# Patient Record
Sex: Male | Born: 1985
Health system: Southern US, Community
[De-identification: ages and names within clinical notes are randomized; demographics above are authoritative.]

## PROBLEM LIST (undated history)

## (undated) DIAGNOSIS — I10 Essential (primary) hypertension: Secondary | ICD-10-CM

## (undated) DIAGNOSIS — K219 Gastro-esophageal reflux disease without esophagitis: Secondary | ICD-10-CM

## (undated) DIAGNOSIS — E785 Hyperlipidemia, unspecified: Secondary | ICD-10-CM

## (undated) DIAGNOSIS — R740 Nonspecific elevation of levels of transaminase and lactic acid dehydrogenase [LDH]: Secondary | ICD-10-CM

## (undated) DIAGNOSIS — Z8489 Family history of other specified conditions: Secondary | ICD-10-CM

## (undated) HISTORY — DX: Essential (primary) hypertension: I10

## (undated) HISTORY — DX: Family history of other specified conditions: Z84.89

## (undated) HISTORY — DX: Gastro-esophageal reflux disease without esophagitis: K21.9

## (undated) HISTORY — DX: Hyperlipidemia, unspecified: E78.5

## (undated) HISTORY — DX: Nonspecific elevation of levels of transaminase and lactic acid dehydrogenase (ldh): R74.0

---
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
## 2006-01-01 ENCOUNTER — Emergency Department (HOSPITAL_COMMUNITY): Admission: EM | Admit: 2006-01-01 | Discharge: 2006-01-01 | Payer: Self-pay | Admitting: Family Medicine

## 2007-06-22 HISTORY — PX: WISDOM TOOTH EXTRACTION: SHX21

## 2010-06-21 DIAGNOSIS — I10 Essential (primary) hypertension: Secondary | ICD-10-CM

## 2010-06-21 HISTORY — DX: Essential (primary) hypertension: I10

## 2012-06-21 DIAGNOSIS — R7401 Elevation of levels of liver transaminase levels: Secondary | ICD-10-CM

## 2012-06-21 DIAGNOSIS — E785 Hyperlipidemia, unspecified: Secondary | ICD-10-CM

## 2012-06-21 HISTORY — DX: Hyperlipidemia, unspecified: E78.5

## 2012-06-21 HISTORY — DX: Elevation of levels of liver transaminase levels: R74.01

## 2013-03-15 LAB — TSH: TSH: 1.27

## 2013-03-15 LAB — COMPREHENSIVE METABOLIC PANEL
ALT: 91 U/L — AB (ref 10–40)
Alkaline Phosphatase: 75 U/L
Creat: 1.08
Glucose: 85
Uric Acid: 8.6

## 2013-03-15 LAB — LIPID PANEL: LDL (calc): 122

## 2013-03-15 LAB — CBC
HGB: 16.3 g/dL
Platelets: 350
WBC: 6.5

## 2013-03-20 LAB — PULMONARY FUNCTION TEST

## 2013-04-02 ENCOUNTER — Telehealth: Payer: Self-pay

## 2013-04-02 NOTE — Telephone Encounter (Signed)
May place in any open 30 min slot 

## 2013-04-02 NOTE — Telephone Encounter (Signed)
The patient called and is hoping to become a new pt and have a physical done.  He states he is trying to obtain a job and they require a physical documented before he can move forward with the hiring process.  He states he has no major medical issues.  Can this patient be worked into to two 15 min slots?    Pt callback - [phone]

## 2013-04-09 ENCOUNTER — Encounter: Payer: Self-pay | Admitting: Family Medicine

## 2013-04-09 ENCOUNTER — Ambulatory Visit (INDEPENDENT_AMBULATORY_CARE_PROVIDER_SITE_OTHER): Payer: BC Managed Care – PPO | Admitting: Family Medicine

## 2013-04-09 ENCOUNTER — Telehealth: Payer: Self-pay | Admitting: Family Medicine

## 2013-04-09 VITALS — BP 110/80 | HR 68 | Temp 98.3°F | Ht 68.0 in | Wt 247.0 lb

## 2013-04-09 DIAGNOSIS — E785 Hyperlipidemia, unspecified: Secondary | ICD-10-CM

## 2013-04-09 DIAGNOSIS — I10 Essential (primary) hypertension: Secondary | ICD-10-CM | POA: Insufficient documentation

## 2013-04-09 DIAGNOSIS — Z0000 Encounter for general adult medical examination without abnormal findings: Secondary | ICD-10-CM | POA: Insufficient documentation

## 2013-04-09 DIAGNOSIS — R7401 Elevation of levels of liver transaminase levels: Secondary | ICD-10-CM | POA: Insufficient documentation

## 2013-04-09 DIAGNOSIS — E669 Obesity, unspecified: Secondary | ICD-10-CM

## 2013-04-09 DIAGNOSIS — Z0001 Encounter for general adult medical examination with abnormal findings: Secondary | ICD-10-CM | POA: Insufficient documentation

## 2013-04-09 MED ORDER — LISINOPRIL 10 MG PO TABS: 10.0000 mg | ORAL_TABLET | Freq: Every day | ORAL | Status: DC

## 2013-04-09 NOTE — Patient Instructions (Signed)
Good to meet you today, call us with questions. Return as needed or in 1 year for next physical.

## 2013-04-09 NOTE — Progress Notes (Signed)
Subjective:    Patient ID: Perry Bender, male    DOB: [birth year], 27 y.o.   MRN: [id number]  HPI CC: new pt to establish  Prior PCP was Dr. Prince Rome who recently moved.  Recent physical done at work including stress test, EKG, labwork and spirometry. (03/15/2013) Spirometry showing FVC 91%, FEV1 94%, ratio 0.84  HTN - compliant with lisinopril 10mg  daily (2012).  No HA, vision changes, CP/tightness, SOB, leg swelling.   Obesity - 256lb to 237lb.  Dieting and staying active.  On review of blood work he brings - transaminitis with AST 47 and ALT 91 found.  lipid panel - TC 186, Trig 193, HDL 25, LDL 122.  Seat belt use discussed. Sunscreen use discussed.  Lives alone, 1 dog Occupation: IT sales professional at [employer], owns mowing business Edu: HS Activity: mows yards Diet: good water, fruits/vegetables daily, herbalife  Medications and allergies reviewed and updated in chart.  Past histories reviewed and updated if relevant as below. There are no active problems to display for this patient.  Past Medical History  Diagnosis Date  . HTN (hypertension) 2012   Past Surgical History  Procedure Laterality Date  . No past surgeries     History  Substance Use Topics  . Smoking status: Never Smoker   . Smokeless tobacco: Never Used  . Alcohol Use: Yes     Comment: Occasional   Family History  Problem Relation Age of Onset  . Cancer Mother     thyroid  . Cancer Father     carcinoid tumor  . Cancer Maternal Grandfather     prostate  . Stroke Paternal Grandfather   . Diabetes Father   . Diabetes Maternal Grandfather   . CAD Maternal Grandmother     CABG x2   Allergies  Allergen Reactions  . Hctz [Hydrochlorothiazide] Other (See Comments)    Headaches   No current outpatient prescriptions on file prior to visit.   No current facility-administered medications on file prior to visit.     Review of Systems  Constitutional: Negative for fever, chills, activity change,  appetite change, fatigue and unexpected weight change.  HENT: Negative for hearing loss.   Eyes: Negative for visual disturbance.  Respiratory: Negative for cough, chest tightness, shortness of breath and wheezing.   Cardiovascular: Negative for chest pain.  Gastrointestinal: Negative for nausea, vomiting, abdominal pain, diarrhea, constipation, blood in stool and abdominal distention.  Genitourinary: Negative for hematuria and difficulty urinating.  Musculoskeletal: Negative for arthralgias, myalgias and neck pain.  Skin: Negative for rash.  Neurological: Negative for dizziness, seizures, syncope and headaches.  Hematological: Negative for adenopathy. Does not bruise/bleed easily.  Psychiatric/Behavioral: Negative for dysphoric mood. The patient is not nervous/anxious.        Objective:   Physical Exam  Nursing note and vitals reviewed. Constitutional: He is oriented to person, place, and time. He appears well-developed and well-nourished. No distress.  obese Body mass index is 37.56 kg/(m^2).  HENT:  Head: Normocephalic and atraumatic.  Right Ear: Hearing, tympanic membrane, external ear and ear canal normal.  Left Ear: Hearing, tympanic membrane, external ear and ear canal normal.  Nose: Nose normal.  Mouth/Throat: Oropharynx is clear and moist. No oropharyngeal exudate.  Eyes: Conjunctivae and EOM are normal. Pupils are equal, round, and reactive to light. No scleral icterus.  Neck: Normal range of motion. Neck supple. No thyromegaly present.  Cardiovascular: Normal rate, regular rhythm, normal heart sounds and intact distal pulses.   No murmur heard. Pulses:  Radial pulses are 2+ on the right side, and 2+ on the left side.  Pulmonary/Chest: Effort normal and breath sounds normal. No respiratory distress. He has no wheezes. He has no rales.  Abdominal: Soft. Bowel sounds are normal. He exhibits no distension and no mass. There is no tenderness. There is no rebound and no  guarding.  Musculoskeletal: Normal range of motion. He exhibits no edema.  Lymphadenopathy:    He has no cervical adenopathy.  Neurological: He is alert and oriented to person, place, and time.  CN grossly intact, station and gait intact  Skin: Skin is warm and dry. No rash noted.  Psychiatric: He has a normal mood and affect. His behavior is normal. Judgment and thought content normal.       Assessment & Plan:

## 2013-04-09 NOTE — Telephone Encounter (Signed)
plz notify that on review of recent labwork - he did have elevated liver function as well as abnormal cholesterol levels. Please ensure not taking excessive amounts of tylenol. i'd like him to return in 4-6 months for recheck liver function. Weight loss will help with both numbers.

## 2013-04-09 NOTE — Assessment & Plan Note (Signed)
Preventative protocols reviewed and updated unless pt declined. Discussed healthy diet and lifestyle.  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  Hoping to transfer to [facility]

## 2013-04-09 NOTE — Assessment & Plan Note (Signed)
Chronic, stable. Refilled 3 mo supply lisinopril.

## 2013-04-09 NOTE — Assessment & Plan Note (Signed)
Dyslipidemia by evidence of blood work last month. Pt working currently on weight loss - which should help. Will need to monitor.

## 2013-04-09 NOTE — Assessment & Plan Note (Signed)
Mild transaminitis on blood work last month. ?fatty liver.  Will have him return in 4-6 mo for recheck and further eval, consider abd [place].

## 2013-04-09 NOTE — Assessment & Plan Note (Signed)
Body mass index is 37.56 kg/(m^2). Pt working on weight loss.  Using herbalife. Encouraged continued attempt.

## 2013-04-10 NOTE — Telephone Encounter (Signed)
Patient notified. He denied excessive tylenol or alcohol use. Advised to try to avoid both if possible. He will call back to schedule lab appt when he knows work schedule. He is already working on weight loss.

## 2013-04-12 ENCOUNTER — Encounter: Payer: Self-pay | Admitting: *Deleted

## 2014-05-20 ENCOUNTER — Other Ambulatory Visit: Payer: Self-pay | Admitting: Family Medicine

## 2014-06-02 ENCOUNTER — Other Ambulatory Visit: Payer: Self-pay | Admitting: Family Medicine

## 2014-06-04 NOTE — Telephone Encounter (Signed)
Received refill request electronically from pharmacy. Last refill 05/20/14 note was sent for patient to schedule an appointment for further refills. Last office visit 04/09/13. Is it okay to refill medication?

## 2014-06-21 DIAGNOSIS — Z8489 Family history of other specified conditions: Secondary | ICD-10-CM

## 2014-06-21 HISTORY — DX: Family history of other specified conditions: Z84.89

## 2014-08-01 ENCOUNTER — Encounter: Payer: Self-pay | Admitting: Family Medicine

## 2014-08-01 ENCOUNTER — Ambulatory Visit (INDEPENDENT_AMBULATORY_CARE_PROVIDER_SITE_OTHER): Payer: PRIVATE HEALTH INSURANCE | Admitting: Family Medicine

## 2014-08-01 VITALS — BP 138/72 | HR 84 | Temp 98.3°F | Wt 253.2 lb

## 2014-08-01 DIAGNOSIS — R197 Diarrhea, unspecified: Secondary | ICD-10-CM | POA: Insufficient documentation

## 2014-08-01 DIAGNOSIS — I10 Essential (primary) hypertension: Secondary | ICD-10-CM

## 2014-08-01 MED ORDER — LISINOPRIL 10 MG PO TABS: 10.0000 mg | ORAL_TABLET | Freq: Every day | ORAL | Status: DC

## 2014-08-01 MED ORDER — CIPROFLOXACIN HCL 500 MG PO TABS: 500.0000 mg | ORAL_TABLET | Freq: Two times a day (BID) | ORAL | Status: DC

## 2014-08-01 NOTE — Progress Notes (Signed)
   BP 138/72 mmHg  Pulse 84  Temp(Src) 98.3 F (36.8 C) (Oral)  Wt 253 lb 4 oz (114.873 kg)   CC: abd pain  Subjective:    Patient ID: Perry Bender, male    DOB: [birth year], 29 y.o.   MRN: [id number]  HPI: Perry Bender is a 29 y.o. male presenting on 08/01/2014 for Abdominal Pain   HTN - well controlled on lisinopril. Doesn't check at home. No HA, vision changes, CP/tightness, SOB, leg swelling.   Abdominal pain with diarrhea for last week. Diarrhea watery initially. Nauseated, diarrhea x 10. Cramping abdominal pain. No new foods, recent travel. No one else sick at home. Some accidents in bed. No vomiting. No blood or mucous in stool. No fevers.  Anything he eats or drinks causes nausea and diarrhea. Thinks he's staying well hydrated.  12mo daughter started to feel sick at home today.   Initially treated with pepto bismol, then immodium - no improvement.   Uses well water but tends to drink bottled water. Voiding ok.   Relevant past medical, surgical, family and social history reviewed and updated as indicated. Interim medical history since our last visit reviewed. Allergies and medications reviewed and updated. No current outpatient prescriptions on file prior to visit.   No current facility-administered medications on file prior to visit.    Review of Systems Per HPI unless specifically indicated above     Objective:    BP 138/72 mmHg  Pulse 84  Temp(Src) 98.3 F (36.8 C) (Oral)  Wt 253 lb 4 oz (114.873 kg)  Wt Readings from Last 3 Encounters:  08/01/14 253 lb 4 oz (114.873 kg)  04/09/13 247 lb (112.038 kg)    Physical Exam  Constitutional: He appears well-developed and well-nourished. No distress.  HENT:  Mouth/Throat: Oropharynx is clear and moist. No oropharyngeal exudate.  Cardiovascular: Normal rate, regular rhythm, normal heart sounds and intact distal pulses.   No murmur heard. Pulmonary/Chest: Effort normal and breath sounds normal. No respiratory  distress. He has no wheezes. He has no rales.  Abdominal: Soft. Normal appearance and bowel sounds are normal. He exhibits no distension and no mass. There is no hepatosplenomegaly. There is no tenderness. There is no rebound, no guarding and no CVA tenderness.  Musculoskeletal: He exhibits no edema.  Lymphadenopathy:    He has no cervical adenopathy.  Skin: Skin is warm and dry. No rash noted.  Nursing note and vitals reviewed.     Assessment & Plan:   Problem List Items Addressed This Visit    HTN (hypertension)    Chronic, stable. Med refilled for 6 months. Advised hold lisinopril while having diarrhea.      Relevant Medications   lisinopril (PRINIVIL,ZESTRIL) tablet   Diarrhea - Primary    Anticipate infectious diarrhea - given duration and frequency of stool episodes treat with cipro 3d course. No bloody stool, no fever. Update if not improving as expected. Did not send stool culture today. Discussed anticipated course of improvement and red flags to seek further care. Discussed contagious through diarrhea. Pt agrees with plan.           Follow up plan: Return in about 6 months (around 01/30/2015), or as needed, for physical.

## 2014-08-01 NOTE — Assessment & Plan Note (Signed)
Anticipate infectious diarrhea - given duration and frequency of stool episodes treat with cipro 3d course. No bloody stool, no fever. Update if not improving as expected. Did not send stool culture today. Discussed anticipated course of improvement and red flags to seek further care. Discussed contagious through diarrhea. Pt agrees with plan.

## 2014-08-01 NOTE — Progress Notes (Signed)
Pre visit review using our clinic review tool, if applicable. No additional management support is needed unless otherwise documented below in the visit note. 

## 2014-08-01 NOTE — Assessment & Plan Note (Signed)
Chronic, stable. Med refilled for 6 months. Advised hold lisinopril while having diarrhea.

## 2014-08-01 NOTE — Patient Instructions (Addendum)
I think you had stomach bug, possibly bacterial so treat with cipro antibiotic twice daily for 3 days. Push fluids and rest. Bland diet for next few days. Appetite should slowly start improving. Let me know if not improving as expected. Hold lisinopril until diarrhea has resolved.  Return in next 4-6 months for physical.  Diarrhea Diarrhea is frequent loose and watery bowel movements. It can cause you to feel weak and dehydrated. Dehydration can cause you to become tired and thirsty, have a dry mouth, and have decreased urination that often is dark yellow. Diarrhea is a sign of another problem, most often an infection that will not last long. In most cases, diarrhea typically lasts 2-3 days. However, it can last longer if it is a sign of something more serious. It is important to treat your diarrhea as directed by your caregiver to lessen or prevent future episodes of diarrhea. CAUSES  Some common causes include:  Gastrointestinal infections caused by viruses, bacteria, or parasites.  Food poisoning or food allergies.  Certain medicines, such as antibiotics, chemotherapy, and laxatives.  Artificial sweeteners and fructose.  Digestive disorders. HOME CARE INSTRUCTIONS  Ensure adequate fluid intake (hydration): Have 1 cup (8 oz) of fluid for each diarrhea episode. Avoid fluids that contain simple sugars or sports drinks, fruit juices, whole milk products, and sodas. Your urine should be clear or pale yellow if you are drinking enough fluids. Hydrate with an oral rehydration solution that you can purchase at pharmacies, retail stores, and online. You can prepare an oral rehydration solution at home by mixing the following ingredients together:   - tsp table salt.   tsp baking soda.   tsp salt substitute containing potassium chloride.  1  tablespoons sugar.  1 L (34 oz) of water.  Certain foods and beverages may increase the speed at which food moves through the gastrointestinal (GI)  tract. These foods and beverages should be avoided and include:  Caffeinated and alcoholic beverages.  High-fiber foods, such as raw fruits and vegetables, nuts, seeds, and whole grain breads and cereals.  Foods and beverages sweetened with sugar alcohols, such as xylitol, sorbitol, and mannitol.  Some foods may be well tolerated and may help thicken stool including:  Starchy foods, such as rice, toast, pasta, low-sugar cereal, oatmeal, grits, baked potatoes, crackers, and bagels.  Bananas.  Applesauce.  Add probiotic-rich foods to help increase healthy bacteria in the GI tract, such as yogurt and fermented milk products.  Wash your hands well after each diarrhea episode.  Only take over-the-counter or prescription medicines as directed by your caregiver.  Take a warm bath to relieve any burning or pain from frequent diarrhea episodes. SEEK IMMEDIATE MEDICAL CARE IF:   You are unable to keep fluids down.  You have persistent vomiting.  You have blood in your stool, or your stools are black and tarry.  You do not urinate in 6-8 hours, or there is only a small amount of very dark urine.  You have abdominal pain that increases or localizes.  You have weakness, dizziness, confusion, or light-headedness.  You have a severe headache.  Your diarrhea gets worse or does not get better.  You have a fever or persistent symptoms for more than 2-3 days.  You have a fever and your symptoms suddenly get worse. MAKE SURE YOU:   Understand these instructions.  Will watch your condition.  Will get help right away if you are not doing well or get worse. Document Released: 05/28/2002 Document Revised:  10/22/2013 Document Reviewed: 02/13/2012 ExitCare Patient Information 2015 [place], [place]. This information is not intended to replace advice given to you by your health care provider. Make sure you discuss any questions you have with your health care provider.

## 2014-08-02 ENCOUNTER — Telehealth: Payer: Self-pay | Admitting: Family Medicine

## 2014-08-02 NOTE — Telephone Encounter (Signed)
emmi emailed °

## 2015-01-04 ENCOUNTER — Other Ambulatory Visit: Payer: Self-pay | Admitting: Family Medicine

## 2015-01-04 DIAGNOSIS — I10 Essential (primary) hypertension: Secondary | ICD-10-CM

## 2015-01-04 DIAGNOSIS — E785 Hyperlipidemia, unspecified: Secondary | ICD-10-CM

## 2015-01-04 DIAGNOSIS — R7401 Elevation of levels of liver transaminase levels: Secondary | ICD-10-CM

## 2015-01-04 DIAGNOSIS — R740 Nonspecific elevation of levels of transaminase and lactic acid dehydrogenase [LDH]: Secondary | ICD-10-CM

## 2015-01-04 DIAGNOSIS — E669 Obesity, unspecified: Secondary | ICD-10-CM

## 2015-01-14 ENCOUNTER — Other Ambulatory Visit (INDEPENDENT_AMBULATORY_CARE_PROVIDER_SITE_OTHER): Payer: 59

## 2015-01-14 DIAGNOSIS — I10 Essential (primary) hypertension: Secondary | ICD-10-CM

## 2015-01-14 DIAGNOSIS — E785 Hyperlipidemia, unspecified: Secondary | ICD-10-CM | POA: Diagnosis not present

## 2015-01-14 DIAGNOSIS — R7401 Elevation of levels of liver transaminase levels: Secondary | ICD-10-CM

## 2015-01-14 DIAGNOSIS — R740 Nonspecific elevation of levels of transaminase and lactic acid dehydrogenase [LDH]: Secondary | ICD-10-CM

## 2015-01-14 DIAGNOSIS — E669 Obesity, unspecified: Secondary | ICD-10-CM | POA: Diagnosis not present

## 2015-01-14 LAB — LIPID PANEL
CHOL/HDL RATIO: 6
Cholesterol: 162 mg/dL (ref 0–200)
HDL: 26.9 mg/dL — ABNORMAL LOW (ref >39.00)
LDL CALC: 111 mg/dL — AB (ref 0–99)
NONHDL: 135.1
Triglycerides: 122 mg/dL (ref 0.0–149.0)
VLDL: 24.4 mg/dL (ref 0.0–40.0)

## 2015-01-14 LAB — COMPREHENSIVE METABOLIC PANEL
ALT: 44 U/L (ref 0–53)
AST: 21 U/L (ref 0–37)
Albumin: 4.3 g/dL (ref 3.5–5.2)
Alkaline Phosphatase: 67 U/L (ref 39–117)
BILIRUBIN TOTAL: 0.5 mg/dL (ref 0.2–1.2)
BUN: 12 mg/dL (ref 6–23)
CO2: 30 mEq/L (ref 19–32)
CREATININE: 1 mg/dL (ref 0.40–1.50)
Calcium: 9.4 mg/dL (ref 8.4–10.5)
Chloride: 102 mEq/L (ref 96–112)
GFR: 94.07 mL/min (ref >60.00)
Glucose, Bld: 93 mg/dL (ref 70–99)
POTASSIUM: 4.8 meq/L (ref 3.5–5.1)
SODIUM: 137 meq/L (ref 135–145)
Total Protein: 7.4 g/dL (ref 6.0–8.3)

## 2015-01-21 ENCOUNTER — Ambulatory Visit (INDEPENDENT_AMBULATORY_CARE_PROVIDER_SITE_OTHER): Payer: 59 | Admitting: Family Medicine

## 2015-01-21 ENCOUNTER — Encounter: Payer: Self-pay | Admitting: Family Medicine

## 2015-01-21 ENCOUNTER — Encounter (INDEPENDENT_AMBULATORY_CARE_PROVIDER_SITE_OTHER): Payer: Self-pay

## 2015-01-21 VITALS — BP 130/70 | HR 76 | Temp 98.1°F | Wt 249.0 lb

## 2015-01-21 DIAGNOSIS — E785 Hyperlipidemia, unspecified: Secondary | ICD-10-CM

## 2015-01-21 DIAGNOSIS — R740 Nonspecific elevation of levels of transaminase and lactic acid dehydrogenase [LDH]: Secondary | ICD-10-CM

## 2015-01-21 DIAGNOSIS — G5601 Carpal tunnel syndrome, right upper limb: Secondary | ICD-10-CM

## 2015-01-21 DIAGNOSIS — R7401 Elevation of levels of liver transaminase levels: Secondary | ICD-10-CM

## 2015-01-21 DIAGNOSIS — Z8489 Family history of other specified conditions: Secondary | ICD-10-CM

## 2015-01-21 DIAGNOSIS — I10 Essential (primary) hypertension: Secondary | ICD-10-CM | POA: Diagnosis not present

## 2015-01-21 DIAGNOSIS — G5602 Carpal tunnel syndrome, left upper limb: Secondary | ICD-10-CM

## 2015-01-21 DIAGNOSIS — G5600 Carpal tunnel syndrome, unspecified upper limb: Secondary | ICD-10-CM | POA: Insufficient documentation

## 2015-01-21 DIAGNOSIS — G5603 Carpal tunnel syndrome, bilateral upper limbs: Secondary | ICD-10-CM

## 2015-01-21 MED ORDER — LISINOPRIL 10 MG PO TABS: 10.0000 mg | ORAL_TABLET | Freq: Every day | ORAL | Status: DC

## 2015-01-21 NOTE — Assessment & Plan Note (Signed)
Chronic, stable. Med refilled for 1 year. Discussed importance of hydration status with ACEI.

## 2015-01-21 NOTE — Assessment & Plan Note (Signed)
Continue to monitor for now. May request hand surgery referral in future.

## 2015-01-21 NOTE — Patient Instructions (Addendum)
med refilled today. Return as needed or in 1 year for follow up.

## 2015-01-21 NOTE — Progress Notes (Signed)
Pre visit review using our clinic review tool, if applicable. No additional management support is needed unless otherwise documented below in the visit note. 

## 2015-01-21 NOTE — Progress Notes (Signed)
BP 130/70 mmHg  Pulse 76  Temp(Src) 98.1 F (36.7 C) (Oral)  Wt 249 lb (112.946 kg)   CC: 46mo f/u visit  Subjective:    Patient ID: Perry Bender, male    DOB: [birth year], 29 y.o.   MRN: [id number]  HPI: ALEXANDAR WEISENBERGER is a 29 y.o. male presenting on 01/21/2015 for Follow-up   HTN - Compliant with current antihypertensive regimen of lisinopril 10mg  daily.  Does not check blood pressures at home.  No low blood pressure readings or symptoms of dizziness/syncope.  Denies HA, vision changes, CP/tightness, SOB, leg swelling.    CTS bothering him. Packed away wrist braces. May want to see ortho for definitive treatment during winter.  Cousin and uncle tested positive for malignant hyperthermia  Relevant past medical, surgical, family and social history reviewed and updated as indicated. Interim medical history since our last visit reviewed. Allergies and medications reviewed and updated. No current outpatient prescriptions on file prior to visit.   No current facility-administered medications on file prior to visit.    Review of Systems Per HPI unless specifically indicated above     Objective:    BP 130/70 mmHg  Pulse 76  Temp(Src) 98.1 F (36.7 C) (Oral)  Wt 249 lb (112.946 kg)  Wt Readings from Last 3 Encounters:  01/21/15 249 lb (112.946 kg)  08/01/14 253 lb 4 oz (114.873 kg)  04/09/13 247 lb (112.038 kg)    Physical Exam  Constitutional: He appears well-developed and well-nourished. No distress.  HENT:  Mouth/Throat: Oropharynx is clear and moist. No oropharyngeal exudate.  Cardiovascular: Normal rate, regular rhythm, normal heart sounds and intact distal pulses.   No murmur heard. Pulmonary/Chest: Effort normal and breath sounds normal. No respiratory distress. He has no wheezes. He has no rales.  Skin: Skin is warm and dry. No rash noted.  Nursing note and vitals reviewed.  Results for orders placed or performed in visit on 01/14/15  Comprehensive metabolic  panel  Result Value Ref Range   Sodium 137 135 - 145 mEq/L   Potassium 4.8 3.5 - 5.1 mEq/L   Chloride 102 96 - 112 mEq/L   CO2 30 19 - 32 mEq/L   Glucose, Bld 93 70 - 99 mg/dL   BUN 12 6 - 23 mg/dL   Creatinine, Ser 1.00 0.40 - 1.50 mg/dL   Total Bilirubin 0.5 0.2 - 1.2 mg/dL   Alkaline Phosphatase 67 39 - 117 U/L   AST 21 0 - 37 U/L   ALT 44 0 - 53 U/L   Total Protein 7.4 6.0 - 8.3 g/dL   Albumin 4.3 3.5 - 5.2 g/dL   Calcium 9.4 8.4 - 10.5 mg/dL   GFR 94.07 >60.00 mL/min  Lipid panel  Result Value Ref Range   Cholesterol 162 0 - 200 mg/dL   Triglycerides 122.0 0.0 - 149.0 mg/dL   HDL 26.90 (L) >39.00 mg/dL   VLDL 24.4 0.0 - 40.0 mg/dL   LDL Cholesterol 111 (H) 0 - 99 mg/dL   Total CHOL/HDL Ratio 6    NonHDL 135.10       Assessment & Plan:   Problem List Items Addressed This Visit    CTS (carpal tunnel syndrome)    Continue to monitor for now. May request hand surgery referral in future.      Dyslipidemia    Mild. Low HDL. LDL adequate.      Family history of malignant hyperthermia   HTN (hypertension) - Primary  Chronic, stable. Med refilled for 1 year. Discussed importance of hydration status with ACEI.      Relevant Medications   lisinopril (PRINIVIL,ZESTRIL) 10 MG tablet   Transaminitis    Resolved.          Follow up plan: Return in about 1 year (around 01/21/2016), or as needed, for annual exam, prior fasting for blood work.

## 2015-01-21 NOTE — Assessment & Plan Note (Signed)
Resolved

## 2015-01-21 NOTE — Assessment & Plan Note (Signed)
Mild. Low HDL. LDL adequate.

## 2016-02-20 ENCOUNTER — Other Ambulatory Visit: Payer: Self-pay | Admitting: Family Medicine

## 2016-08-03 DIAGNOSIS — G43109 Migraine with aura, not intractable, without status migrainosus: Secondary | ICD-10-CM | POA: Diagnosis not present

## 2016-08-10 ENCOUNTER — Ambulatory Visit (INDEPENDENT_AMBULATORY_CARE_PROVIDER_SITE_OTHER): Payer: 59 | Admitting: Family Medicine

## 2016-08-10 ENCOUNTER — Encounter (INDEPENDENT_AMBULATORY_CARE_PROVIDER_SITE_OTHER): Payer: Self-pay

## 2016-08-10 ENCOUNTER — Encounter: Payer: Self-pay | Admitting: Family Medicine

## 2016-08-10 VITALS — BP 140/100 | HR 90 | Temp 98.0°F | Wt 248.8 lb

## 2016-08-10 DIAGNOSIS — J302 Other seasonal allergic rhinitis: Secondary | ICD-10-CM | POA: Insufficient documentation

## 2016-08-10 DIAGNOSIS — IMO0001 Reserved for inherently not codable concepts without codable children: Secondary | ICD-10-CM

## 2016-08-10 DIAGNOSIS — I10 Essential (primary) hypertension: Secondary | ICD-10-CM

## 2016-08-10 DIAGNOSIS — E669 Obesity, unspecified: Secondary | ICD-10-CM | POA: Diagnosis not present

## 2016-08-10 MED ORDER — FLUTICASONE PROPIONATE 50 MCG/ACT NA SUSP: 2.0000 | Freq: Every day | NASAL | 3 refills | Status: DC

## 2016-08-10 MED ORDER — LISINOPRIL 10 MG PO TABS: 10.0000 mg | ORAL_TABLET | Freq: Every day | ORAL | 0 refills | Status: DC

## 2016-08-10 MED ORDER — LISINOPRIL 10 MG PO TABS: 10.0000 mg | ORAL_TABLET | Freq: Every day | ORAL | 1 refills | Status: DC

## 2016-08-10 NOTE — Assessment & Plan Note (Signed)
Discussed antihistamine and INS use - sent in Rx for flonase.

## 2016-08-10 NOTE — Assessment & Plan Note (Signed)
bp elevated today off lisinopril - will restart this.

## 2016-08-10 NOTE — Progress Notes (Signed)
BP (!) 140/100 (BP Location: Right Arm, Cuff Size: Large)   Pulse 90   Temp 98 F (36.7 C) (Oral)   Wt 248 lb 12.8 oz (112.9 kg)   SpO2 97%   BMI 37.83 kg/m    CC: med refill visit Subjective:    Patient ID: Perry Bender, male    DOB: 10-21-1985, 31 y.o.   MRN: [id number]  HPI: Perry Bender is a 31 y.o. male presenting on 08/10/2016 for Blood Pressure Check (Med Refills)   HTN - off lisinopril for last several months. Prior on lisinopril 10mg  daily. HTN dx was made ([id number] systolics) when he was on testosterone booster and smoking. Does not check blood pressures at home but has way of checking. No low blood pressure readings or symptoms of dizziness/syncope. Denies CP/tightness, SOB, leg swelling.   Recent migraine with aura seen by eye doctor. Controlled with excedrin migraine.  Some sinus congestion worse this time of year.   Working on weight loss - watching portions, limiting carbs.   Fmhx malignant hyperthermia.  Relevant past medical, surgical, family and social history reviewed and updated as indicated. Interim medical history since our last visit reviewed. Allergies and medications reviewed and updated. Outpatient Medications Prior to Visit  Medication Sig Dispense Refill  . lisinopril (PRINIVIL,ZESTRIL) 10 MG tablet TAKE 1 TABLET (10 MG TOTAL) BY MOUTH DAILY. 90 tablet 0   No facility-administered medications prior to visit.      Per HPI unless specifically indicated in ROS section below Review of Systems     Objective:    BP (!) 140/100 (BP Location: Right Arm, Cuff Size: Large)   Pulse 90   Temp 98 F (36.7 C) (Oral)   Wt 248 lb 12.8 oz (112.9 kg)   SpO2 97%   BMI 37.83 kg/m   Wt Readings from Last 3 Encounters:  08/10/16 248 lb 12.8 oz (112.9 kg)  01/21/15 249 lb (112.9 kg)  08/01/14 253 lb 4 oz (114.9 kg)    Physical Exam  Constitutional: He appears well-developed and well-nourished. No distress.  HENT:  Head: Normocephalic and atraumatic.    Nose: Nose normal. No mucosal edema or rhinorrhea. Right sinus exhibits no maxillary sinus tenderness and no frontal sinus tenderness. Left sinus exhibits no maxillary sinus tenderness and no frontal sinus tenderness.  Mouth/Throat: Oropharynx is clear and moist. No oropharyngeal exudate.  Nasal mucosal injection  Eyes: Conjunctivae and EOM are normal. Pupils are equal, round, and reactive to light. No scleral icterus.  Neck: Normal range of motion. Neck supple. Carotid bruit is not present.  Cardiovascular: Normal rate, regular rhythm, normal heart sounds and intact distal pulses.   No murmur heard. Pulmonary/Chest: Breath sounds normal. No respiratory distress. He has no wheezes. He has no rales.  Musculoskeletal: He exhibits no edema.  Skin: Skin is warm and dry. No rash noted.  Vitals reviewed.  Results for orders placed or performed in visit on 01/14/15  Comprehensive metabolic panel  Result Value Ref Range   Sodium 137 135 - 145 mEq/L   Potassium 4.8 3.5 - 5.1 mEq/L   Chloride 102 96 - 112 mEq/L   CO2 30 19 - 32 mEq/L   Glucose, Bld 93 70 - 99 mg/dL   BUN 12 6 - 23 mg/dL   Creatinine, Ser 1.00 0.40 - 1.50 mg/dL   Total Bilirubin 0.5 0.2 - 1.2 mg/dL   Alkaline Phosphatase 67 39 - 117 U/L   AST 21 0 - 37 U/L  ALT 44 0 - 53 U/L   Total Protein 7.4 6.0 - 8.3 g/dL   Albumin 4.3 3.5 - 5.2 g/dL   Calcium 9.4 8.4 - 10.5 mg/dL   GFR 94.07 >60.00 mL/min  Lipid panel  Result Value Ref Range   Cholesterol 162 0 - 200 mg/dL   Triglycerides 122.0 0.0 - 149.0 mg/dL   HDL 26.90 (L) >39.00 mg/dL   VLDL 24.4 0.0 - 40.0 mg/dL   LDL Cholesterol 111 (H) 0 - 99 mg/dL   Total CHOL/HDL Ratio 6    NonHDL 135.10       Assessment & Plan:   Problem List Items Addressed This Visit    HTN (hypertension) - Primary    bp elevated today off lisinopril - will restart this.       Relevant Medications   lisinopril (PRINIVIL,ZESTRIL) 10 MG tablet   Obesity, Class II, BMI 35-39.9, with  comorbidity    Discussed healthy diet and lifestyle change to affect sustainable weight loss.       Seasonal allergic rhinitis    Discussed antihistamine and INS use - sent in Rx for flonase.           Follow up plan: Return in about 3 months (around 11/07/2016) for follow up visit.  Perry Bush, MD

## 2016-08-10 NOTE — Assessment & Plan Note (Signed)
Discussed healthy diet and lifestyle change to affect sustainable weight loss.  

## 2016-08-10 NOTE — Progress Notes (Signed)
Pre visit review using our clinic review tool, if applicable. No additional management support is needed unless otherwise documented below in the visit note. 

## 2016-08-10 NOTE — Patient Instructions (Addendum)
Go ahead and start lisinopril 10mg  daily. Monitor blood pressure and let me know if staying elevated.  For sinus congestion/allergies - start daily antihistamine like claritin or zyrtec and flonase nasal steroid.  Return in 3-4 months for follow-up blood pressure.

## 2016-09-21 DIAGNOSIS — Z0000 Encounter for general adult medical examination without abnormal findings: Secondary | ICD-10-CM | POA: Diagnosis not present

## 2016-09-21 DIAGNOSIS — Z008 Encounter for other general examination: Secondary | ICD-10-CM | POA: Diagnosis not present

## 2016-09-22 DIAGNOSIS — Z0000 Encounter for general adult medical examination without abnormal findings: Secondary | ICD-10-CM | POA: Diagnosis not present

## 2016-10-06 ENCOUNTER — Other Ambulatory Visit: Payer: Self-pay | Admitting: Family Medicine

## 2016-10-06 ENCOUNTER — Encounter: Payer: Self-pay | Admitting: Family Medicine

## 2016-10-06 ENCOUNTER — Encounter (INDEPENDENT_AMBULATORY_CARE_PROVIDER_SITE_OTHER): Payer: Self-pay

## 2016-10-06 ENCOUNTER — Ambulatory Visit (INDEPENDENT_AMBULATORY_CARE_PROVIDER_SITE_OTHER): Payer: 59 | Admitting: Family Medicine

## 2016-10-06 VITALS — BP 126/86 | HR 76 | Temp 97.5°F | Wt 242.0 lb

## 2016-10-06 DIAGNOSIS — N509 Disorder of male genital organs, unspecified: Secondary | ICD-10-CM | POA: Diagnosis not present

## 2016-10-06 DIAGNOSIS — I10 Essential (primary) hypertension: Secondary | ICD-10-CM | POA: Diagnosis not present

## 2016-10-06 DIAGNOSIS — N5089 Other specified disorders of the male genital organs: Secondary | ICD-10-CM

## 2016-10-06 MED ORDER — LISINOPRIL 5 MG PO TABS: 5.0000 mg | ORAL_TABLET | Freq: Every day | ORAL | 3 refills | Status: DC

## 2016-10-06 NOTE — Addendum Note (Signed)
Addended by: Royann Shivers A on: 10/06/2016 08:48 AM   Modules accepted: Orders

## 2016-10-06 NOTE — Progress Notes (Signed)
Pre visit review using our clinic review tool, if applicable. No additional management support is needed unless otherwise documented below in the visit note. 

## 2016-10-06 NOTE — Assessment & Plan Note (Signed)
Some low readings and hypotensive symptoms on 10mg  lisinopril - will decrease to 5mg , pt will monitor sxs and BP and update me if concerns (too low, too high). Pt agrees with plan.

## 2016-10-06 NOTE — Progress Notes (Signed)
   BP 126/86   Pulse 76   Temp 97.5 F (36.4 C) (Oral)   Wt 242 lb (109.8 kg)   BMI 36.80 kg/m    CC: check knot on testicle Subjective:    Patient ID: Perry Bender, male    DOB: 09-Jan-1986, 31 y.o.   MRN: [id number]  HPI: Perry Bender is a 31 y.o. male presenting on 10/06/2016 for Check knot on testicle (right; no pain; x8 months)   8 months ago noticed small lump on testicle, not tender. No urinary symptoms. No fevers/chills. No injury or trauma.   fmhx thyroid, prostate cancer and carcinoid tumor.   Noticing some low blood pressure readings as well as orthostatic dizziness. Drank monster drink right before coming here.   Relevant past medical, surgical, family and social history reviewed and updated as indicated. Interim medical history since our last visit reviewed. Allergies and medications reviewed and updated. Outpatient Medications Prior to Visit  Medication Sig Dispense Refill  . fluticasone (FLONASE) 50 MCG/ACT nasal spray Place 2 sprays into both nostrils daily. 16 g 3  . lisinopril (PRINIVIL,ZESTRIL) 10 MG tablet Take 1 tablet (10 mg total) by mouth daily. 90 tablet 1   No facility-administered medications prior to visit.      Per HPI unless specifically indicated in ROS section below Review of Systems     Objective:    BP 126/86   Pulse 76   Temp 97.5 F (36.4 C) (Oral)   Wt 242 lb (109.8 kg)   BMI 36.80 kg/m   Wt Readings from Last 3 Encounters:  10/06/16 242 lb (109.8 kg)  08/10/16 248 lb 12.8 oz (112.9 kg)  01/21/15 249 lb (112.9 kg)    Physical Exam  Constitutional: He appears well-developed and well-nourished. No distress.  Abdominal: Hernia confirmed negative in the right inguinal area and confirmed negative in the left inguinal area.  Genitourinary: Penis normal. Right testis shows mass (small firm pea sized lump at anterior testicle) and swelling. Right testis shows no tenderness. Right testis is descended. Left testis shows swelling.  Left testis shows no mass and no tenderness. Left testis is descended. Circumcised.  Genitourinary Comments: Scrotal swelling likely hydrocele related No hernias appreciated  Lymphadenopathy:       Right: No inguinal adenopathy present.       Left: No inguinal adenopathy present.  Nursing note and vitals reviewed.     Assessment & Plan:   Problem List Items Addressed This Visit    HTN (hypertension)    Some low readings and hypotensive symptoms on 10mg  lisinopril - will decrease to 5mg , pt will monitor sxs and BP and update me if concerns (too low, too high). Pt agrees with plan.       Relevant Medications   lisinopril (PRINIVIL,ZESTRIL) 5 MG tablet   Testicular mass - Primary    Anticipate benign calcium deposit vs epididymal cyst on right. Will check scrotal US to verify. Pt agrees with plan.       Relevant Orders   US Scrotum       Follow up plan: No Follow-up on file.  Perry Bush, MD

## 2016-10-06 NOTE — Assessment & Plan Note (Signed)
Anticipate benign calcium deposit vs epididymal cyst on right. Will check scrotal US to verify. Pt agrees with plan.

## 2016-10-06 NOTE — Patient Instructions (Addendum)
Decrease lisinopril to 5mg  daily - cut in half if you can until you run out.  We will schedule you for scrotal ultrasound.  See Rosaria Ferries on your way out.

## 2016-10-13 ENCOUNTER — Ambulatory Visit: Payer: 59

## 2016-11-17 ENCOUNTER — Ambulatory Visit: Payer: 59 | Admitting: Family Medicine

## 2016-11-19 ENCOUNTER — Encounter: Payer: Self-pay | Admitting: Family Medicine

## 2016-11-19 DIAGNOSIS — N503 Cyst of epididymis: Secondary | ICD-10-CM | POA: Diagnosis not present

## 2016-11-23 ENCOUNTER — Telehealth: Payer: Self-pay | Admitting: Family Medicine

## 2016-11-23 DIAGNOSIS — N5089 Other specified disorders of the male genital organs: Secondary | ICD-10-CM

## 2016-11-23 NOTE — Telephone Encounter (Signed)
plz notify - testicular ultrasound returned showing benign small calcification at R testicle. No follow up needed.

## 2016-11-23 NOTE — Telephone Encounter (Signed)
Pt was advised of results and voiced understanding.

## 2017-03-05 ENCOUNTER — Other Ambulatory Visit: Payer: Self-pay | Admitting: Family Medicine

## 2017-04-02 DIAGNOSIS — J069 Acute upper respiratory infection, unspecified: Secondary | ICD-10-CM | POA: Diagnosis not present

## 2017-05-18 DIAGNOSIS — D235 Other benign neoplasm of skin of trunk: Secondary | ICD-10-CM | POA: Diagnosis not present

## 2017-05-18 DIAGNOSIS — L853 Xerosis cutis: Secondary | ICD-10-CM | POA: Diagnosis not present

## 2017-05-18 DIAGNOSIS — L308 Other specified dermatitis: Secondary | ICD-10-CM | POA: Diagnosis not present

## 2017-05-18 DIAGNOSIS — L918 Other hypertrophic disorders of the skin: Secondary | ICD-10-CM | POA: Diagnosis not present

## 2017-07-30 ENCOUNTER — Other Ambulatory Visit: Payer: Self-pay | Admitting: Family Medicine

## 2017-09-04 ENCOUNTER — Other Ambulatory Visit: Payer: Self-pay | Admitting: Family Medicine

## 2017-09-09 ENCOUNTER — Telehealth: Payer: Self-pay

## 2017-09-09 ENCOUNTER — Other Ambulatory Visit: Payer: Self-pay | Admitting: Family Medicine

## 2017-09-09 MED ORDER — LISINOPRIL 5 MG PO TABS: 5.0000 mg | ORAL_TABLET | Freq: Every day | ORAL | 1 refills | Status: DC

## 2017-09-09 NOTE — Telephone Encounter (Signed)
Sent refill for lisinopril 5 mg.

## 2017-09-29 ENCOUNTER — Ambulatory Visit: Payer: 59 | Admitting: Family Medicine

## 2017-09-29 ENCOUNTER — Encounter (INDEPENDENT_AMBULATORY_CARE_PROVIDER_SITE_OTHER): Payer: Self-pay

## 2017-09-29 ENCOUNTER — Encounter: Payer: Self-pay | Admitting: Family Medicine

## 2017-09-29 VITALS — BP 120/82 | HR 67 | Temp 97.9°F | Wt 229.2 lb

## 2017-09-29 DIAGNOSIS — E669 Obesity, unspecified: Secondary | ICD-10-CM

## 2017-09-29 DIAGNOSIS — E785 Hyperlipidemia, unspecified: Secondary | ICD-10-CM | POA: Diagnosis not present

## 2017-09-29 DIAGNOSIS — I10 Essential (primary) hypertension: Secondary | ICD-10-CM | POA: Diagnosis not present

## 2017-09-29 DIAGNOSIS — K449 Diaphragmatic hernia without obstruction or gangrene: Secondary | ICD-10-CM | POA: Insufficient documentation

## 2017-09-29 DIAGNOSIS — R252 Cramp and spasm: Secondary | ICD-10-CM | POA: Insufficient documentation

## 2017-09-29 DIAGNOSIS — K219 Gastro-esophageal reflux disease without esophagitis: Secondary | ICD-10-CM | POA: Diagnosis not present

## 2017-09-29 DIAGNOSIS — E66811 Obesity, class 1: Secondary | ICD-10-CM

## 2017-09-29 LAB — LIPID PANEL
CHOLESTEROL: 184 mg/dL (ref 0–200)
HDL: 27.2 mg/dL — ABNORMAL LOW (ref >39.00)
LDL Cholesterol: 138 mg/dL — ABNORMAL HIGH (ref 0–99)
NonHDL: 156.66
Total CHOL/HDL Ratio: 7
Triglycerides: 95 mg/dL (ref 0.0–149.0)
VLDL: 19 mg/dL (ref 0.0–40.0)

## 2017-09-29 LAB — BASIC METABOLIC PANEL
BUN: 18 mg/dL (ref 6–23)
CHLORIDE: 102 meq/L (ref 96–112)
CO2: 28 mEq/L (ref 19–32)
Calcium: 9.7 mg/dL (ref 8.4–10.5)
Creatinine, Ser: 0.99 mg/dL (ref 0.40–1.50)
GFR: 93.44 mL/min (ref >60.00)
Glucose, Bld: 98 mg/dL (ref 70–99)
Potassium: 4.6 mEq/L (ref 3.5–5.1)
SODIUM: 137 meq/L (ref 135–145)

## 2017-09-29 LAB — TSH: TSH: 1.14 u[IU]/mL (ref 0.35–4.50)

## 2017-09-29 LAB — MAGNESIUM: Magnesium: 2.3 mg/dL (ref 1.5–2.5)

## 2017-09-29 MED ORDER — ESOMEPRAZOLE MAGNESIUM 20 MG PO CPDR: 20.0000 mg | DELAYED_RELEASE_CAPSULE | Freq: Every day | ORAL | 1 refills | Status: DC

## 2017-09-29 MED ORDER — AMLODIPINE BESYLATE 2.5 MG PO TABS: 2.5000 mg | ORAL_TABLET | Freq: Every day | ORAL | 3 refills | Status: DC

## 2017-09-29 NOTE — Assessment & Plan Note (Signed)
Congratulated on weight loss to date - 20 lbs! Encouraged ongoing efforts at sustainable healthy diet and lifestyle changes for ongoing weight loss.

## 2017-09-29 NOTE — Assessment & Plan Note (Signed)
Managed with OTC nexium 20mg  daily. Omeprazole was ineffective. With recent weight loss anticipate better control - discussed trying QOD dosing.

## 2017-09-29 NOTE — Assessment & Plan Note (Addendum)
Discussed with patient. Check Mg and other electrolytes. Trial off lisinopril.

## 2017-09-29 NOTE — Patient Instructions (Signed)
Labs today.  Stop lisinopril. Start amlodipine 2.5mg  daily for blood pressure.  Nexium refilled.  Continue healthy diet changes and weight loss. Good to see you today, call us with questions.

## 2017-09-29 NOTE — Assessment & Plan Note (Signed)
Chronic, stable. Only recently dropped lisinopril dose which resolved dizzy episodes he was having. However pt concerned about cramping from lisinopril - will change to low dose amlodipine 2.5mg  and monitor effect. Advised monitoring for ankle edema.

## 2017-09-29 NOTE — Assessment & Plan Note (Signed)
Update FLP today.

## 2017-09-29 NOTE — Progress Notes (Signed)
BP 120/82 (BP Location: Left Arm, Patient Position: Sitting, Cuff Size: Large)   Pulse 67   Temp 97.9 F (36.6 C) (Oral)   Wt 229 lb 4 oz (104 kg)   SpO2 97%   BMI 34.86 kg/m    CC: med refill visit Subjective:    Patient ID: Perry Bender, male    DOB: 09/02/1985, 32 y.o.   MRN: [id number]  HPI: Perry Bender is a 32 y.o. male presenting on 09/29/2017 for Medication Refill (Needs refill for lisionpril.  Also requests rx for Nexium. Currently taking OTC.  Has taken omeprazole in the past, no help. ) and Leg Pain (C/o bilateral LE cramping, usually at night. Thinks it may be due to lisinopril.)   HTN - Compliant with current antihypertensive regimen of lisinopril 5mg  daily. Last month just dropped the dose. Does not check blood pressures at home. No low blood pressure readings or symptoms of dizziness/syncope. Denies HA, vision changes, CP/tightness, SOB, leg swelling.  Some leg cramping at night time - attributes to lisinopril. Worse in summer possibly due to dehydration.   GERD - treating with nexium 20mg  daily. Omeprazole previously wasn't effective.   Changed diet, working on weight loss, increased vegetables.   Relevant past medical, surgical, family and social history reviewed and updated as indicated. Interim medical history since our last visit reviewed. Allergies and medications reviewed and updated. Outpatient Medications Prior to Visit  Medication Sig Dispense Refill  . fluticasone (FLONASE) 50 MCG/ACT nasal spray USE 2 SPRAYS INTO BOTH NOSTRILS DAILY--NEEDS ANNUAL PHYSICAL APPT FOR FURTHER REFILLS 16 g 0  . Multiple Vitamins-Minerals (MULTIVITAMIN MEN PO) Take 1 tablet by mouth daily.    Marland Kitchen esomeprazole (NEXIUM) 20 MG capsule Take 20 mg by mouth daily at 12 noon. OTC    . lisinopril (PRINIVIL,ZESTRIL) 5 MG tablet Take 1 tablet (5 mg total) by mouth daily. 30 tablet 1   No facility-administered medications prior to visit.      Per HPI unless specifically indicated  in ROS section below Review of Systems     Objective:    BP 120/82 (BP Location: Left Arm, Patient Position: Sitting, Cuff Size: Large)   Pulse 67   Temp 97.9 F (36.6 C) (Oral)   Wt 229 lb 4 oz (104 kg)   SpO2 97%   BMI 34.86 kg/m   Wt Readings from Last 3 Encounters:  09/29/17 229 lb 4 oz (104 kg)  10/06/16 242 lb (109.8 kg)  08/10/16 248 lb 12.8 oz (112.9 kg)    Physical Exam  Constitutional: He appears well-developed and well-nourished. No distress.  HENT:  Mouth/Throat: Oropharynx is clear and moist. No oropharyngeal exudate.  Neck: No thyromegaly present.  Cardiovascular: Normal rate, regular rhythm, normal heart sounds and intact distal pulses.  No murmur heard. Pulmonary/Chest: Effort normal and breath sounds normal. No respiratory distress. He has no wheezes. He has no rales.  Musculoskeletal: He exhibits no edema.  Skin: Skin is warm and dry. No rash noted.  Psychiatric: He has a normal mood and affect.  Nursing note and vitals reviewed.  Results for orders placed or performed in visit on 01/14/15  Comprehensive metabolic panel  Result Value Ref Range   Sodium 137 135 - 145 mEq/L   Potassium 4.8 3.5 - 5.1 mEq/L   Chloride 102 96 - 112 mEq/L   CO2 30 19 - 32 mEq/L   Glucose, Bld 93 70 - 99 mg/dL   BUN 12 6 - 23 mg/dL  Creatinine, Ser 1.00 0.40 - 1.50 mg/dL   Total Bilirubin 0.5 0.2 - 1.2 mg/dL   Alkaline Phosphatase 67 39 - 117 U/L   AST 21 0 - 37 U/L   ALT 44 0 - 53 U/L   Total Protein 7.4 6.0 - 8.3 g/dL   Albumin 4.3 3.5 - 5.2 g/dL   Calcium 9.4 8.4 - 10.5 mg/dL   GFR 94.07 >60.00 mL/min  Lipid panel  Result Value Ref Range   Cholesterol 162 0 - 200 mg/dL   Triglycerides 122.0 0.0 - 149.0 mg/dL   HDL 26.90 (L) >39.00 mg/dL   VLDL 24.4 0.0 - 40.0 mg/dL   LDL Cholesterol 111 (H) 0 - 99 mg/dL   Total CHOL/HDL Ratio 6    NonHDL 135.10       Assessment & Plan:   Problem List Items Addressed This Visit    Dyslipidemia    Update FLP today.         GERD (gastroesophageal reflux disease)    Managed with OTC nexium 20mg  daily. Omeprazole was ineffective. With recent weight loss anticipate better control - discussed trying QOD dosing.       Relevant Medications   esomeprazole (NEXIUM) 20 MG capsule   HTN (hypertension) - Primary    Chronic, stable. Only recently dropped lisinopril dose which resolved dizzy episodes he was having. However pt concerned about cramping from lisinopril - will change to low dose amlodipine 2.5mg  and monitor effect. Advised monitoring for ankle edema.       Relevant Medications   amLODipine (NORVASC) 2.5 MG tablet   Other Relevant Orders   Lipid panel   TSH   Basic metabolic panel   Leg cramps    Discussed with patient. Check Mg and other electrolytes. Trial off lisinopril.      Relevant Orders   Basic metabolic panel   Magnesium   Obesity, Class I, BMI 30.0-34.9 (see actual BMI)    Congratulated on weight loss to date - 20 lbs! Encouraged ongoing efforts at sustainable healthy diet and lifestyle changes for ongoing weight loss.           Meds ordered this encounter  Medications  . amLODipine (NORVASC) 2.5 MG tablet    Sig: Take 1 tablet (2.5 mg total) by mouth daily.    Dispense:  90 tablet    Refill:  3  . esomeprazole (NEXIUM) 20 MG capsule    Sig: Take 1 capsule (20 mg total) by mouth daily at 12 noon.    Dispense:  90 capsule    Refill:  1   Orders Placed This Encounter  Procedures  . Lipid panel  . TSH  . Basic metabolic panel  . Magnesium    Follow up plan: Return in about 1 year (around 09/30/2018), or if symptoms worsen or fail to improve.  Ria Bush, MD

## 2017-10-04 ENCOUNTER — Telehealth: Payer: Self-pay

## 2017-10-04 NOTE — Telephone Encounter (Signed)
Started PA for Nexium DR 20 mg cap, key:  [id number].  Decision pending.

## 2017-10-06 DIAGNOSIS — Z0000 Encounter for general adult medical examination without abnormal findings: Secondary | ICD-10-CM | POA: Diagnosis not present

## 2017-10-06 DIAGNOSIS — Z125 Encounter for screening for malignant neoplasm of prostate: Secondary | ICD-10-CM | POA: Diagnosis not present

## 2017-10-06 DIAGNOSIS — Z008 Encounter for other general examination: Secondary | ICD-10-CM | POA: Diagnosis not present

## 2017-10-13 MED ORDER — PANTOPRAZOLE SODIUM 20 MG PO TBEC: 20.0000 mg | DELAYED_RELEASE_TABLET | Freq: Every day | ORAL | 3 refills | Status: DC

## 2017-10-13 NOTE — Telephone Encounter (Signed)
Received PA denial stating pt must have a history of failure, contraindication or intolerance to all of the following: Dexilant Pantoprazole Rabeprazole

## 2017-10-13 NOTE — Addendum Note (Signed)
Addended by: Ria Bush on: 10/13/2017 06:14 PM   Modules accepted: Orders

## 2017-10-13 NOTE — Telephone Encounter (Addendum)
plz notify pt of below and that I've sent in protonix 20mg  for him to try first. If not controlling symptoms, may double up to 40mg  daily and let us know effect.

## 2017-10-14 NOTE — Telephone Encounter (Signed)
Spoke with pt informing him PA was denied.  Also, relayed Dr. Synthia Innocent message and instructions.  Pt verbalizes understanding.

## 2017-11-16 ENCOUNTER — Other Ambulatory Visit: Payer: Self-pay | Admitting: Family Medicine

## 2017-11-30 ENCOUNTER — Other Ambulatory Visit: Payer: Self-pay | Admitting: Family Medicine

## 2017-12-01 ENCOUNTER — Other Ambulatory Visit: Payer: Self-pay | Admitting: Family Medicine

## 2018-04-03 DIAGNOSIS — Z23 Encounter for immunization: Secondary | ICD-10-CM | POA: Diagnosis not present

## 2018-06-26 DIAGNOSIS — L738 Other specified follicular disorders: Secondary | ICD-10-CM | POA: Diagnosis not present

## 2018-06-26 DIAGNOSIS — D485 Neoplasm of uncertain behavior of skin: Secondary | ICD-10-CM | POA: Diagnosis not present

## 2018-06-26 DIAGNOSIS — D225 Melanocytic nevi of trunk: Secondary | ICD-10-CM | POA: Diagnosis not present

## 2018-06-26 DIAGNOSIS — L814 Other melanin hyperpigmentation: Secondary | ICD-10-CM | POA: Diagnosis not present

## 2018-06-26 DIAGNOSIS — D2261 Melanocytic nevi of right upper limb, including shoulder: Secondary | ICD-10-CM | POA: Diagnosis not present

## 2018-07-18 ENCOUNTER — Ambulatory Visit: Payer: 59 | Admitting: Family Medicine

## 2018-07-18 ENCOUNTER — Encounter: Payer: Self-pay | Admitting: Family Medicine

## 2018-07-18 ENCOUNTER — Encounter: Payer: Self-pay | Admitting: Gastroenterology

## 2018-07-18 VITALS — BP 120/78 | HR 77 | Temp 98.4°F | Ht 68.0 in

## 2018-07-18 DIAGNOSIS — R1030 Lower abdominal pain, unspecified: Secondary | ICD-10-CM

## 2018-07-18 DIAGNOSIS — K219 Gastro-esophageal reflux disease without esophagitis: Secondary | ICD-10-CM

## 2018-07-18 DIAGNOSIS — R197 Diarrhea, unspecified: Secondary | ICD-10-CM | POA: Diagnosis not present

## 2018-07-18 LAB — CBC WITH DIFFERENTIAL/PLATELET
Basophils Absolute: 0.1 10*3/uL (ref 0.0–0.1)
Basophils Relative: 0.8 % (ref 0.0–3.0)
Eosinophils Absolute: 0.4 10*3/uL (ref 0.0–0.7)
Eosinophils Relative: 5.5 % — ABNORMAL HIGH (ref 0.0–5.0)
HEMATOCRIT: 46.9 % (ref 39.0–52.0)
Hemoglobin: 16.1 g/dL (ref 13.0–17.0)
Lymphocytes Relative: 26.3 % (ref 12.0–46.0)
Lymphs Abs: 1.9 10*3/uL (ref 0.7–4.0)
MCHC: 34.3 g/dL (ref 30.0–36.0)
MCV: 91.3 fl (ref 78.0–100.0)
Monocytes Absolute: 0.6 10*3/uL (ref 0.1–1.0)
Monocytes Relative: 8.5 % (ref 3.0–12.0)
Neutro Abs: 4.4 10*3/uL (ref 1.4–7.7)
Neutrophils Relative %: 58.9 % (ref 43.0–77.0)
Platelets: 317 10*3/uL (ref 150.0–400.0)
RBC: 5.13 Mil/uL (ref 4.22–5.81)
RDW: 12.3 % (ref 11.5–15.5)
WBC: 7.4 10*3/uL (ref 4.0–10.5)

## 2018-07-18 LAB — COMPREHENSIVE METABOLIC PANEL
ALT: 45 U/L (ref 0–53)
AST: 22 U/L (ref 0–37)
Albumin: 4.3 g/dL (ref 3.5–5.2)
Alkaline Phosphatase: 66 U/L (ref 39–117)
BUN: 11 mg/dL (ref 6–23)
CO2: 30 mEq/L (ref 19–32)
Calcium: 9.6 mg/dL (ref 8.4–10.5)
Chloride: 101 mEq/L (ref 96–112)
Creatinine, Ser: 0.96 mg/dL (ref 0.40–1.50)
GFR: 90.63 mL/min (ref >60.00)
Glucose, Bld: 84 mg/dL (ref 70–99)
Potassium: 4.7 mEq/L (ref 3.5–5.1)
Sodium: 139 mEq/L (ref 135–145)
Total Bilirubin: 0.5 mg/dL (ref 0.2–1.2)
Total Protein: 7.2 g/dL (ref 6.0–8.3)

## 2018-07-18 LAB — LIPASE: Lipase: 5 U/L — ABNORMAL LOW (ref 11.0–59.0)

## 2018-07-18 LAB — TSH: TSH: 1.25 u[IU]/mL (ref 0.35–4.50)

## 2018-07-18 MED ORDER — PANTOPRAZOLE SODIUM 40 MG PO TBEC: 40.0000 mg | DELAYED_RELEASE_TABLET | Freq: Every day | ORAL | 1 refills | Status: DC

## 2018-07-18 NOTE — Patient Instructions (Signed)
Labs today Increase protonix to 40mg  daily See Rosaria Ferries for referral to GI.

## 2018-07-18 NOTE — Progress Notes (Signed)
BP 120/78 (BP Location: Left Arm, Patient Position: Sitting, Cuff Size: Large)   Pulse 77   Temp 98.4 F (36.9 C) (Oral)   Ht 5\' 8"  (1.727 m)   SpO2 95%   BMI 34.86 kg/m    CC: discuss abd pain Subjective:    Patient ID: Perry Bender, male    DOB: 01/07/1986, 33 y.o.   MRN: [id number]  HPI: Perry Bender is a 33 y.o. male presenting on 07/18/2018 for Diarrhea (C/o diarrhea off and on for yrs. Most recent episode started 1 wk ago. Also, c/o dull, achy abd pain, reflux and gas. Denies any N/V. Wants to see GI. ) and Other (Wants BS checked. )   Loose stools this past week described as loose to watery. 4-6 stools per day. Constant dull pain and urge to defecate, no improvement after BM. Night time episodes. H/o irritable bowel but this is more severe. Treating with kaopectate.   Episodes happen intermittently throughout the year. No triggers noted, doesn't seem to be diet related.   No fevers/chills, blood in stool, mucous in stool, or greasy stools. No clay colored stools. No vomiting.  Has been managing with kaopectate.   GERD - notes ongoing reflux symptoms at night time. Can wake up choking with food regurgitation. Now sleeping on recliner because of this. Daytime symptoms largely well controlled. Previously on nexium 20mg  daily with benefit. He did have some dysphagia prior to starting PPI. Omeprazole helped heartburn but he had persistent aspiration type symptoms. Insurance did not cover nexium so we trialed protonix 20mg  daily.   No unexpected weight loss.  No recent travel. No recent new restaurants  Our scale is broken today - cannot weight patient.      Relevant past medical, surgical, family and social history reviewed and updated as indicated. Interim medical history since our last visit reviewed. Allergies and medications reviewed and updated. Outpatient Medications Prior to Visit  Medication Sig Dispense Refill  . amLODipine (NORVASC) 2.5 MG tablet Take 1  tablet (2.5 mg total) by mouth daily. 90 tablet 3  . clindamycin (CLEOCIN T) 1 % external solution Apply 1 application topically 2 (two) times daily. Appy on the skin twice daily    . fluticasone (FLONASE) 50 MCG/ACT nasal spray USE 2 SPRAYS INTO BOTH NOSTRILS DAILY--NEEDS ANNUAL PHYSICAL APPT FOR FURTHER REFILLS 16 g 0  . ketoconazole (NIZORAL) 2 % shampoo Apply topically. Lather and let sit on skin for a few minutes prior to rinsing when you shower    . pantoprazole (PROTONIX) 20 MG tablet Take 1 tablet (20 mg total) by mouth daily. 90 tablet 3  . Multiple Vitamins-Minerals (MULTIVITAMIN MEN PO) Take 1 tablet by mouth daily.     No facility-administered medications prior to visit.      Per HPI unless specifically indicated in ROS section below Review of Systems Objective:    BP 120/78 (BP Location: Left Arm, Patient Position: Sitting, Cuff Size: Large)   Pulse 77   Temp 98.4 F (36.9 C) (Oral)   Ht 5\' 8"  (1.727 m)   SpO2 95%   BMI 34.86 kg/m   Wt Readings from Last 3 Encounters:  09/29/17 229 lb 4 oz (104 kg)  10/06/16 242 lb (109.8 kg)  08/10/16 248 lb 12.8 oz (112.9 kg)    Physical Exam Vitals signs and nursing note reviewed.  Constitutional:      General: He is not in acute distress.    Appearance: Normal appearance. He is not  ill-appearing.  HENT:     Mouth/Throat:     Mouth: Mucous membranes are moist.     Pharynx: Oropharynx is clear.  Cardiovascular:     Rate and Rhythm: Normal rate and regular rhythm.     Pulses: Normal pulses.     Heart sounds: Normal heart sounds. No murmur.  Pulmonary:     Effort: Pulmonary effort is normal. No respiratory distress.     Breath sounds: Normal breath sounds. No wheezing, rhonchi or rales.  Abdominal:     General: Abdomen is flat. Bowel sounds are normal. There is no distension.     Palpations: Abdomen is soft. There is no mass.     Tenderness: There is no abdominal tenderness. There is no guarding or rebound. Negative signs  include Murphy's sign.     Hernia: No hernia is present.  Musculoskeletal:     Right lower leg: No edema.     Left lower leg: No edema.  Neurological:     Mental Status: He is alert.  Psychiatric:        Mood and Affect: Mood normal.       Results for orders placed or performed in visit on 07/18/18  Comprehensive metabolic panel  Result Value Ref Range   Sodium 139 135 - 145 mEq/L   Potassium 4.7 3.5 - 5.1 mEq/L   Chloride 101 96 - 112 mEq/L   CO2 30 19 - 32 mEq/L   Glucose, Bld 84 70 - 99 mg/dL   BUN 11 6 - 23 mg/dL   Creatinine, Ser 0.96 0.40 - 1.50 mg/dL   Total Bilirubin 0.5 0.2 - 1.2 mg/dL   Alkaline Phosphatase 66 39 - 117 U/L   AST 22 0 - 37 U/L   ALT 45 0 - 53 U/L   Total Protein 7.2 6.0 - 8.3 g/dL   Albumin 4.3 3.5 - 5.2 g/dL   Calcium 9.6 8.4 - 10.5 mg/dL   GFR 90.63 >60.00 mL/min  TSH  Result Value Ref Range   TSH 1.25 0.35 - 4.50 uIU/mL  CBC with Differential/Platelet  Result Value Ref Range   WBC 7.4 4.0 - 10.5 K/uL   RBC 5.13 4.22 - 5.81 Mil/uL   Hemoglobin 16.1 13.0 - 17.0 g/dL   HCT 46.9 39.0 - 52.0 %   MCV 91.3 78.0 - 100.0 fl   MCHC 34.3 30.0 - 36.0 g/dL   RDW 12.3 11.5 - 15.5 %   Platelets 317.0 150.0 - 400.0 K/uL   Neutrophils Relative % 58.9 43.0 - 77.0 %   Lymphocytes Relative 26.3 12.0 - 46.0 %   Monocytes Relative 8.5 3.0 - 12.0 %   Eosinophils Relative 5.5 (H) 0.0 - 5.0 %   Basophils Relative 0.8 0.0 - 3.0 %   Neutro Abs 4.4 1.4 - 7.7 K/uL   Lymphs Abs 1.9 0.7 - 4.0 K/uL   Monocytes Absolute 0.6 0.1 - 1.0 K/uL   Eosinophils Absolute 0.4 0.0 - 0.7 K/uL   Basophils Absolute 0.1 0.0 - 0.1 K/uL  Lipase  Result Value Ref Range   Lipase 5.0 (L) 11.0 - 59.0 U/L   Assessment & Plan:   Problem List Items Addressed This Visit    GERD (gastroesophageal reflux disease)    Heartburn controlled, however having evening breakthrough symptoms despite protonix 20mg  daily. No dysphagia or red flags, ?ring/stricture. Will refer to GI for evaluation.         Relevant Medications   pantoprazole (PROTONIX) 40 MG tablet  Other Relevant Orders   Ambulatory referral to Gastroenterology   Diarrhea - Primary    Benign exam. Endorses intermittent episodes of recurrent diarrhea ongoing over years, acutely worse this past week. Not consistent with IBS (residual pain after BM) or IBS (no blood in stool). Doubt malabsorption. No weight loss. Check labs today (CBC, CMP, lipase), consider GI pathogen panel r/o infection. Hasn't found dietary trigger.       Relevant Orders   Ambulatory referral to Gastroenterology   Comprehensive metabolic panel (Completed)   TSH (Completed)   CBC with Differential/Platelet (Completed)   Lipase (Completed)    Other Visit Diagnoses    Lower abdominal pain       Relevant Orders   Ambulatory referral to Gastroenterology   Comprehensive metabolic panel (Completed)   TSH (Completed)   CBC with Differential/Platelet (Completed)   Lipase (Completed)       Meds ordered this encounter  Medications  . pantoprazole (PROTONIX) 40 MG tablet    Sig: Take 1 tablet (40 mg total) by mouth daily.    Dispense:  90 tablet    Refill:  1   Orders Placed This Encounter  Procedures  . Comprehensive metabolic panel  . TSH  . CBC with Differential/Platelet  . Lipase  . Ambulatory referral to Gastroenterology    Referral Priority:   Routine    Referral Type:   Consultation    Referral Reason:   Specialty Services Required    Number of Visits Requested:   1    Follow up plan: Return if symptoms worsen or fail to improve.  Ria Bush, MD

## 2018-07-19 ENCOUNTER — Other Ambulatory Visit: Payer: Self-pay | Admitting: Family Medicine

## 2018-07-19 DIAGNOSIS — R197 Diarrhea, unspecified: Secondary | ICD-10-CM

## 2018-07-19 DIAGNOSIS — R1030 Lower abdominal pain, unspecified: Secondary | ICD-10-CM

## 2018-07-19 NOTE — Assessment & Plan Note (Addendum)
Benign exam. Endorses intermittent episodes of recurrent diarrhea ongoing over years, acutely worse this past week. Not consistent with IBS (residual pain after BM) or IBS (no blood in stool). Doubt malabsorption. No weight loss. Check labs today (CBC, CMP, lipase), consider GI pathogen panel r/o infection. Hasn't found dietary trigger.

## 2018-07-19 NOTE — Assessment & Plan Note (Addendum)
Heartburn controlled, however having evening breakthrough symptoms despite protonix 20mg  daily. No dysphagia or red flags, ?ring/stricture. Will refer to GI for evaluation.

## 2018-07-26 ENCOUNTER — Encounter: Payer: Self-pay | Admitting: Gastroenterology

## 2018-07-26 ENCOUNTER — Ambulatory Visit: Payer: 59 | Admitting: Gastroenterology

## 2018-07-26 VITALS — BP 120/90 | HR 84 | Ht 66.75 in | Wt 251.0 lb

## 2018-07-26 DIAGNOSIS — R197 Diarrhea, unspecified: Secondary | ICD-10-CM

## 2018-07-26 DIAGNOSIS — K219 Gastro-esophageal reflux disease without esophagitis: Secondary | ICD-10-CM | POA: Diagnosis not present

## 2018-07-26 NOTE — Progress Notes (Signed)
HPI: This is a very pleasant 33 year old man who was referred to me by Ria Bush, MD  to evaluate chronic GERD, chronic intermittent diarrhea.    Chief complaint is chronic GERD, chronic intermittent diarrhea  First, he has had trouble with GERD most of his life.  He describes daily pyrosis.  He describes water brash.  He will have choking at night intermittently as well.  He does not have dysphasia.  He has been intermittently on antiacids for this.  He does not take his proton pump inhibitor at the correct time in relation to meals.  I do not think that he is ever tried H2 blockers.  Second he has had what sounds like chronic loose stools most of his life.  Intermittently this can be worse.  He never sees bleeding.  He does not have significant abdominal pains.  During these intermittent worsening episodes of his loose stools he will have 4 or 5 loose stools in the morning.  Sometimes also later on in the day.  This can last for days or can just be 1 day at a time.  He can sometimes go weeks or months with solid regular bowel movements.  He has never had specific testing for his loose stools.  He has never had a colonoscopy.  His PCP ordered stool GI pathogen panel recently but he did not turned that any at  His weight fluctuates but stays within the same range it seems.  He drinks quite an excessive amount of caffeine.  3 or 4 at least diet Pepsi's daily and 1 or 2 monster diet drinks.   Old Data Reviewed: Blood work January 2020; normal CBC, normal complete metabolic profile, normal TSH.    Review of systems: Pertinent positive and negative review of systems were noted in the above HPI section. All other review negative.   Past Medical History:  Diagnosis Date  . Dyslipidemia 2014  . Family history of malignant hyperthermia 2016   paternal uncle and cousin  . GERD (gastroesophageal reflux disease)   . HTN (hypertension) 2012  . Transaminitis 2014    Past Surgical History:   Procedure Laterality Date  . WISDOM TOOTH EXTRACTION  2009    Current Outpatient Medications  Medication Sig Dispense Refill  . amLODipine (NORVASC) 2.5 MG tablet Take 1 tablet (2.5 mg total) by mouth daily. 90 tablet 3  . clindamycin (CLEOCIN T) 1 % external solution Apply 1 application topically 2 (two) times daily. Appy on the skin twice daily    . fluticasone (FLONASE) 50 MCG/ACT nasal spray USE 2 SPRAYS INTO BOTH NOSTRILS DAILY--NEEDS ANNUAL PHYSICAL APPT FOR FURTHER REFILLS 16 g 0  . ketoconazole (NIZORAL) 2 % shampoo Apply topically. Lather and let sit on skin for a few minutes prior to rinsing when you shower    . Multiple Vitamins-Minerals (MULTIVITAMIN MEN PO) Take 1 tablet by mouth daily.    . pantoprazole (PROTONIX) 40 MG tablet Take 1 tablet (40 mg total) by mouth daily. 90 tablet 1   No current facility-administered medications for this visit.     Allergies as of 07/26/2018 - Review Complete 07/26/2018  Allergen Reaction Noted  . Hctz [hydrochlorothiazide] Other (See Comments) 04/09/2013  . Lisinopril Other (See Comments) 07/26/2018    Family History  Problem Relation Age of Onset  . Thyroid cancer Mother   . Cancer Father        carcinoid tumor  . Diabetes Father   . Lymphoma Father  B cell, bone and lung  . Diabetes Maternal Grandfather   . Irritable bowel syndrome Maternal Grandfather   . Prostate cancer Maternal Grandfather   . Stroke Paternal Grandfather   . CAD Maternal Grandmother        CABG x2  . Congestive Heart Failure Maternal Grandmother   . Irritable bowel syndrome Maternal Grandmother   . Malignant hyperthermia Paternal Uncle        and cousin, uncle can't eat red med    Social History   Socioeconomic History  . Marital status: Single    Spouse name: Not on file  . Number of children: 1  . Years of education: Not on file  . Highest education level: Not on file  Occupational History  . Not on file  Social Needs  . Financial  resource strain: Not on file  . Food insecurity:    Worry: Not on file    Inability: Not on file  . Transportation needs:    Medical: Not on file    Non-medical: Not on file  Tobacco Use  . Smoking status: Never Smoker  . Smokeless tobacco: Never Used  Substance and Sexual Activity  . Alcohol use: Yes    Comment: Occasional  . Drug use: No  . Sexual activity: Not on file  Lifestyle  . Physical activity:    Days per week: Not on file    Minutes per session: Not on file  . Stress: Not on file  Relationships  . Social connections:    Talks on phone: Not on file    Gets together: Not on file    Attends religious service: Not on file    Active member of club or organization: Not on file    Attends meetings of clubs or organizations: Not on file    Relationship status: Not on file  . Intimate partner violence:    Fear of current or ex partner: Not on file    Emotionally abused: Not on file    Physically abused: Not on file    Forced sexual activity: Not on file  Other Topics Concern  . Not on file  Social History Narrative   Lives alone, 1 dog   Occupation: Airline pilot at [employer], owns mowing business   Edu: HS   Activity: mows yards   Diet: good water, fruits/vegetables daily, herbalife     Physical Exam: BP 120/90 (BP Location: Left Arm, Patient Position: Sitting, Cuff Size: Normal)   Pulse 84   Ht 5' 6.75" (1.695 m) Comment: height measured without shoes  Wt 251 lb (113.9 kg)   BMI 39.61 kg/m  Constitutional: generally well-appearing Psychiatric: alert and oriented x3 Eyes: extraocular movements intact Mouth: oral pharynx moist, no lesions Neck: supple no lymphadenopathy Cardiovascular: heart regular rate and rhythm Lungs: clear to auscultation bilaterally Abdomen: soft, nontender, nondistended, no obvious ascites, no peritoneal signs, normal bowel sounds Extremities: no lower extremity edema bilaterally Skin: no lesions on visible  extremities   Assessment and plan: 33 y.o. male with chronic GERD, chronic intermittent diarrhea  I am certainly struck by the excessive caffeine intake.  I explained to him that this could be contributing to both of his GI issues of chronic loose stools and chronic GERD.  I recommended that he try to cut back significantly down to 1 caffeinated beverage in the morning only.  He is not taking his proton pump inhibitor at the correct time in relation to his morning food and I educated him about  that.  He is also going to start taking a OTC H2 blocker, famotidine 20 mg at bedtime every night.  He is going to complete the GI pathogen panel that was previously requested and if it is negative that I think he will probably need further testing and I would proceed with colonoscopy for his diarrhea as well as EGD for his chronic GERD symptoms at the same time.    Please see the "Patient Instructions" section for addition details about the plan.   Owens Loffler, MD [facility] 07/26/2018, 11:12 AM  Cc: Ria Bush, MD

## 2018-07-26 NOTE — Patient Instructions (Addendum)
Complete your stool studies.  Cut back seriously on caffeine.  Start a single OTC imodium, one pill every morning after you wake.  You should change the way you are taking your antiacid medicine (protonix) so that you are taking it 20-30 minutes prior to a decent meal as that is the way the pill is designed to work most effectively.  Start taking pepcid (famotidine) 20mg  pill at bedtime every night.  You very likely will need colonoscopy and EGD, await stool testing above.  Thank you for entrusting me with your care and choosing [facility].  Dr Ardis Hughs

## 2018-09-29 ENCOUNTER — Other Ambulatory Visit: Payer: Self-pay | Admitting: Family Medicine

## 2018-10-09 ENCOUNTER — Other Ambulatory Visit: Payer: Self-pay | Admitting: Family Medicine

## 2018-10-09 NOTE — Telephone Encounter (Signed)
Noted  

## 2018-10-09 NOTE — Telephone Encounter (Signed)
E-scribed Flonase refill.  Pls schedule CPE in Jun or July 2020.

## 2018-10-09 NOTE — Telephone Encounter (Signed)
plz phone in flonase due to E prescribing error.

## 2018-10-09 NOTE — Telephone Encounter (Signed)
Lvm for pt to call us back and schedule CPE for Jun or July.

## 2018-11-27 ENCOUNTER — Other Ambulatory Visit: Payer: Self-pay | Admitting: Family Medicine

## 2019-01-14 ENCOUNTER — Other Ambulatory Visit: Payer: Self-pay | Admitting: Family Medicine

## 2019-03-21 ENCOUNTER — Other Ambulatory Visit: Payer: Self-pay

## 2019-03-21 DIAGNOSIS — Z20822 Contact with and (suspected) exposure to covid-19: Secondary | ICD-10-CM

## 2019-03-22 LAB — NOVEL CORONAVIRUS, NAA: SARS-CoV-2, NAA: NOT DETECTED

## 2019-09-11 ENCOUNTER — Telehealth: Payer: Self-pay | Admitting: Family Medicine

## 2019-09-11 NOTE — Telephone Encounter (Signed)
Pt called to schedule med refill He stated he has to have cpx with [facility]  For work and insurance will only pay for 1 a year

## 2019-09-11 NOTE — Telephone Encounter (Signed)
Noted.  E-scribed refill. 

## 2019-09-11 NOTE — Telephone Encounter (Signed)
Noted  

## 2019-09-11 NOTE — Telephone Encounter (Signed)
Called patient and left voicemail for patient to call office and schedule cpe and labs

## 2019-09-11 NOTE — Telephone Encounter (Signed)
Plz schedule CPE and lab visits.  Then return encounter to me for refill.

## 2019-09-14 ENCOUNTER — Ambulatory Visit: Payer: 59 | Admitting: Family Medicine

## 2019-09-14 ENCOUNTER — Other Ambulatory Visit: Payer: Self-pay

## 2019-09-14 ENCOUNTER — Encounter: Payer: Self-pay | Admitting: Family Medicine

## 2019-09-14 VITALS — BP 134/86 | HR 75 | Temp 97.9°F | Ht 66.75 in | Wt 254.4 lb

## 2019-09-14 DIAGNOSIS — I10 Essential (primary) hypertension: Secondary | ICD-10-CM

## 2019-09-14 DIAGNOSIS — Z6841 Body Mass Index (BMI) 40.0 and over, adult: Secondary | ICD-10-CM

## 2019-09-14 DIAGNOSIS — J302 Other seasonal allergic rhinitis: Secondary | ICD-10-CM

## 2019-09-14 DIAGNOSIS — E785 Hyperlipidemia, unspecified: Secondary | ICD-10-CM

## 2019-09-14 DIAGNOSIS — K219 Gastro-esophageal reflux disease without esophagitis: Secondary | ICD-10-CM

## 2019-09-14 MED ORDER — PANTOPRAZOLE SODIUM 40 MG PO TBEC: 40.0000 mg | DELAYED_RELEASE_TABLET | Freq: Every day | ORAL | 3 refills | Status: DC

## 2019-09-14 MED ORDER — AMLODIPINE BESYLATE 2.5 MG PO TABS: 2.5000 mg | ORAL_TABLET | Freq: Every day | ORAL | 3 refills | Status: DC

## 2019-09-14 MED ORDER — TRIAMCINOLONE ACETONIDE 55 MCG/ACT NA AERO: 2.0000 | INHALATION_SPRAY | Freq: Every day | NASAL | 6 refills | Status: DC

## 2019-09-14 MED ORDER — FAMOTIDINE 20 MG PO TABS: 20.0000 mg | ORAL_TABLET | Freq: Every day | ORAL | 1 refills | Status: DC

## 2019-09-14 MED ORDER — ESOMEPRAZOLE MAGNESIUM 40 MG PO CPDR: 40.0000 mg | DELAYED_RELEASE_CAPSULE | Freq: Every day | ORAL | 3 refills | Status: DC

## 2019-09-14 NOTE — Patient Instructions (Addendum)
Bring me copy of labs from upcoming physical. I've sent nexium to the pharmacy - to see if insurance will cover. If not, fill protonix. May take pepcid at night time for breakthrough symptoms.  Try nasacort in place of flonase - may be better tolerated Amlodipine refilled - continue this for blood pressure control.  Back off monsters.

## 2019-09-14 NOTE — Progress Notes (Signed)
This visit was conducted in person.  BP 134/86 (BP Location: Left Arm, Patient Position: Sitting, Cuff Size: Large)   Pulse 75   Temp 97.9 F (36.6 C) (Temporal)   Ht 5' 6.75" (1.695 m)   Wt 254 lb 7 oz (115.4 kg)   SpO2 95%   BMI 40.15 kg/m   BP Readings from Last 3 Encounters:  09/14/19 134/86  07/26/18 120/90  07/18/18 120/78    CC: HTN f/u - med refill Subjective:    Patient ID: Perry Bender, male    DOB: 10-Jun-1986, 34 y.o.   MRN: [id number]  HPI: Perry Bender is a 34 y.o. male presenting on 09/14/2019 for Hypertension (Here for f/u. 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  HTN - Compliant with current antihypertensive regimen of amlodipine 2.5mg  daily.  Does not check blood pressures at home. No low blood pressure readings or symptoms of dizziness/syncope. Denies HA, vision changes, CP/tightness, SOB, leg swelling.     GERD - managed with protonix 40mg  daily. breakthrough symptoms even with 1 missed dose.. Finds nexium worked better but wasn't covered by Insurance underwriter. No dysphagia, early satiety.  Referred to GI Ardis Hughs) last year - rec EGD/colonoscopy (for ongoing diarrhea), but never completed.  Current some day smoker.  Significant caffeine intake - several sugar free monsters/day (140mg  caffeine each).   Works as Chief Financial Officer for [employer]      Relevant past medical, surgical, family and social history reviewed and updated as indicated. Interim medical history since our last visit reviewed. Allergies and medications reviewed and updated. Outpatient Medications Prior to Visit  Medication Sig Dispense Refill  . clindamycin (CLEOCIN T) 1 % external solution Apply 1 application topically 2 (two) times daily. Appy on the skin twice daily    . fluticasone (FLONASE) 50 MCG/ACT nasal spray USE 2 SPRAYS INTO BOTH NOSTRILS DAILY--NEEDS ANNUAL PHYSICAL APPT FOR FURTHER REFILLS 16 g 1  . amLODipine (NORVASC) 2.5 MG tablet TAKE 1 TABLET BY MOUTH EVERY DAY 90 tablet 0    . pantoprazole (PROTONIX) 40 MG tablet TAKE 1 TABLET BY MOUTH EVERY DAY 90 tablet 1  . ketoconazole (NIZORAL) 2 % shampoo Apply topically. Lather and let sit on skin for a few minutes prior to rinsing when you shower    . Multiple Vitamins-Minerals (MULTIVITAMIN MEN PO) Take 1 tablet by mouth daily.     No facility-administered medications prior to visit.     Per HPI unless specifically indicated in ROS section below Review of Systems Objective:    BP 134/86 (BP Location: Left Arm, Patient Position: Sitting, Cuff Size: Large)   Pulse 75   Temp 97.9 F (36.6 C) (Temporal)   Ht 5' 6.75" (1.695 m)   Wt 254 lb 7 oz (115.4 kg)   SpO2 95%   BMI 40.15 kg/m   Wt Readings from Last 3 Encounters:  09/14/19 254 lb 7 oz (115.4 kg)  07/26/18 251 lb (113.9 kg)  09/29/17 229 lb 4 oz (104 kg)    Physical Exam Vitals and nursing note reviewed.  Constitutional:      Appearance: Normal appearance. He is not ill-appearing.  Neck:     Thyroid: No thyromegaly or thyroid tenderness.  Cardiovascular:     Rate and Rhythm: Normal rate and regular rhythm.     Pulses: Normal pulses.     Heart sounds: Normal heart sounds. No murmur.  Pulmonary:     Effort: Pulmonary effort is normal. No respiratory distress.  Breath sounds: Normal breath sounds. No wheezing, rhonchi or rales.  Musculoskeletal:     Right lower leg: No edema.     Left lower leg: No edema.  Neurological:     Mental Status: He is alert.  Psychiatric:        Mood and Affect: Mood normal.        Behavior: Behavior normal.       Lab Results  Component Value Date   CHOL 184 09/29/2017   HDL 27.20 (L) 09/29/2017   LDLCALC 138 (H) 09/29/2017   TRIG 95.0 09/29/2017   CHOLHDL 7 09/29/2017    Lab Results  Component Value Date   CREATININE 0.96 07/18/2018   BUN 11 07/18/2018   NA 139 07/18/2018   K 4.7 07/18/2018   CL 101 07/18/2018   CO2 30 07/18/2018    Lab Results  Component Value Date   TSH 1.25 07/18/2018     Assessment & Plan:  This visit occurred during the SARS-CoV-2 public health emergency.  Safety protocols were in place, including screening questions prior to the visit, additional usage of staff PPE, and extensive cleaning of exam room while observing appropriate contact time as indicated for disinfecting solutions.   Problem List Items Addressed This Visit    Seasonal allergic rhinitis    Reviewed current regimen - he doesn't like the flowery scent of flonase - suggested try nasacort (both now OTC).       Morbid obesity with BMI of 40.0-44.9, adult ([place])    Encouraged healthy diet and lifestyle for sustainable weight loss.       HTN (hypertension)    Stable period on current regimen - continue.       Relevant Medications   amLODipine (NORVASC) 2.5 MG tablet   GERD (gastroesophageal reflux disease)    Doing ok on daily protonix - however finds easy breakthrough symptoms if even 1 dose is missed.  Finds nexium worked better for him - will send nexium in to see if insurance will cover this.  Discussed adding pepcid 20mg  at night time.  Reviewed relation of smoking and caffeine use to GERD. Encouraged limiting his monster energy drinks. Anticipate weight also contributes.  No red flags.      Relevant Medications   pantoprazole (PROTONIX) 40 MG tablet   esomeprazole (NEXIUM) 40 MG capsule   famotidine (PEPCID) 20 MG tablet   Dyslipidemia    I asked him to bring me upcoming labs for work physical           Meds ordered this encounter  Medications  . amLODipine (NORVASC) 2.5 MG tablet    Sig: Take 1 tablet (2.5 mg total) by mouth daily.    Dispense:  90 tablet    Refill:  3  . pantoprazole (PROTONIX) 40 MG tablet    Sig: Take 1 tablet (40 mg total) by mouth daily.    Dispense:  90 tablet    Refill:  3  . triamcinolone (NASACORT) 55 MCG/ACT AERO nasal inhaler    Sig: Place 2 sprays into the nose daily.    Dispense:  1 Inhaler    Refill:  6  . esomeprazole (NEXIUM) 40 MG  capsule    Sig: Take 1 capsule (40 mg total) by mouth daily.    Dispense:  90 capsule    Refill:  3    Failed protonix - if insurance won't cover, may fill protonix  . famotidine (PEPCID) 20 MG tablet    Sig: Take 1  tablet (20 mg total) by mouth at bedtime.    Dispense:  90 tablet    Refill:  1   No orders of the defined types were placed in this encounter.   Patient Instructions  Bring me copy of labs from upcoming physical. I've sent nexium to the pharmacy - to see if insurance will cover. If not, fill protonix. May take pepcid at night time for breakthrough symptoms.  Try nasacort in place of flonase - may be better tolerated Amlodipine refilled - continue this for blood pressure control.  Back off monsters.    Follow up plan: Return in about 1 year (around 09/13/2020), or if symptoms worsen or fail to improve, for follow up visit.  Ria Bush, MD

## 2019-09-15 ENCOUNTER — Encounter: Payer: Self-pay | Admitting: Family Medicine

## 2019-09-15 NOTE — Assessment & Plan Note (Signed)
I asked him to bring me upcoming labs for work physical

## 2019-09-15 NOTE — Assessment & Plan Note (Addendum)
Doing ok on daily protonix - however finds easy breakthrough symptoms if even 1 dose is missed.  Finds nexium worked better for him - will send nexium in to see if insurance will cover this.  Discussed adding pepcid 20mg  at night time.  Reviewed relation of smoking and caffeine use to GERD. Encouraged limiting his monster energy drinks. Anticipate weight also contributes.  No red flags.

## 2019-09-15 NOTE — Assessment & Plan Note (Signed)
Stable period on current regimen - continue.  

## 2019-09-15 NOTE — Assessment & Plan Note (Signed)
Encouraged healthy diet and lifestyle for sustainable weight loss.  

## 2019-09-15 NOTE — Assessment & Plan Note (Signed)
Reviewed current regimen - he doesn't like the flowery scent of flonase - suggested try nasacort (both now OTC).

## 2019-10-18 ENCOUNTER — Other Ambulatory Visit: Payer: Self-pay | Admitting: Family Medicine

## 2020-02-11 ENCOUNTER — Telehealth: Payer: Self-pay | Admitting: *Deleted

## 2020-02-11 ENCOUNTER — Ambulatory Visit: Payer: 59 | Admitting: Family Medicine

## 2020-02-11 ENCOUNTER — Ambulatory Visit (INDEPENDENT_AMBULATORY_CARE_PROVIDER_SITE_OTHER)
Admission: RE | Admit: 2020-02-11 | Discharge: 2020-02-11 | Disposition: A | Discharge status: Home / Self Care | Payer: 59 | Source: Ambulatory Visit | Attending: Family Medicine | Admitting: Family Medicine

## 2020-02-11 ENCOUNTER — Encounter: Payer: Self-pay | Admitting: Family Medicine

## 2020-02-11 ENCOUNTER — Other Ambulatory Visit: Payer: Self-pay

## 2020-02-11 VITALS — BP 126/96 | HR 107 | Temp 97.4°F | Ht 66.75 in | Wt 245.4 lb

## 2020-02-11 DIAGNOSIS — M79662 Pain in left lower leg: Secondary | ICD-10-CM

## 2020-02-11 DIAGNOSIS — Z23 Encounter for immunization: Secondary | ICD-10-CM

## 2020-02-11 MED ORDER — DOXYCYCLINE HYCLATE 100 MG PO TABS: 100.0000 mg | ORAL_TABLET | Freq: Two times a day (BID) | ORAL | 0 refills | Status: DC

## 2020-02-11 NOTE — Patient Instructions (Signed)
Go to the lab on the way out.   If you have mychart we'll likely use that to update you.    Start doxycycline.   Update [place] as needed.  Keep it elevated as much as you can.  If fevers or feeling worse, then call the clinic.  Tetanus shot today.  Take care.  Glad to see you.

## 2020-02-11 NOTE — Telephone Encounter (Signed)
Patient left a voicemail stating that he wants to know when he had his last Tetanus vaccine. Called patient back and got his voicemail. Left message for patient to call back.  No record of patient having a tetanus in Epic.

## 2020-02-11 NOTE — Progress Notes (Signed)
This visit occurred during the SARS-CoV-2 public health emergency.  Safety protocols were in place, including screening questions prior to the visit, additional usage of staff PPE, and extensive cleaning of exam room while observing appropriate contact time as indicated for disinfecting solutions.  Taking amlodipine w/o cramps.  BP has not been low with 2.5mg  amlodipine.    Was working outside today. A piece of old wire went into L shin when weedeating.  Went in about 1/2 to 1 inch total of wire.  He pulled it out today after the event. He cleaned the area some in the meantime. Locally sore to the touch.  He is out of date for tetanus.  D/w pt.  No FCNAVD.  Not bleeding.    Meds, vitals, and allergies reviewed.   ROS: Per HPI unless specifically indicated in ROS section   nad ncat rrr ctab Left lower leg with normal inspection except for small epithelial defect where he reported the wire entered his leg. Locally sore but no purulent discharge. No discharge of any variety. No bruising. No spreading erythema. Able to bear weight. Distally grossly neurovascularly intact. Able to bear weight.

## 2020-02-12 NOTE — Telephone Encounter (Signed)
Pt had Tdap vaccine yesterday.

## 2020-02-15 DIAGNOSIS — M79662 Pain in left lower leg: Secondary | ICD-10-CM | POA: Insufficient documentation

## 2020-02-15 NOTE — Assessment & Plan Note (Signed)
With no foreign body seen on x-ray. Tetanus updated. Advised to keep the area clean. Start doxycycline. Update [place] as needed. Routine cautions given. He agrees.

## 2020-08-20 ENCOUNTER — Encounter: Payer: Self-pay | Admitting: Podiatry

## 2020-08-20 ENCOUNTER — Ambulatory Visit: Payer: 59 | Admitting: Podiatry

## 2020-08-20 ENCOUNTER — Other Ambulatory Visit: Payer: Self-pay | Admitting: Podiatry

## 2020-08-20 ENCOUNTER — Other Ambulatory Visit: Payer: Self-pay

## 2020-08-20 ENCOUNTER — Ambulatory Visit (INDEPENDENT_AMBULATORY_CARE_PROVIDER_SITE_OTHER): Payer: 59

## 2020-08-20 DIAGNOSIS — Q66222 Congenital metatarsus adductus, left foot: Secondary | ICD-10-CM

## 2020-08-20 DIAGNOSIS — M722 Plantar fascial fibromatosis: Secondary | ICD-10-CM

## 2020-08-20 DIAGNOSIS — Q66221 Congenital metatarsus adductus, right foot: Secondary | ICD-10-CM

## 2020-08-24 ENCOUNTER — Encounter: Payer: Self-pay | Admitting: Podiatry

## 2020-08-24 NOTE — Progress Notes (Signed)
  Subjective:  Patient ID: Perry Bender, male    DOB: 1986/04/21,  MRN: [id number]  Chief Complaint  Patient presents with  . Foot Pain    Patient presents today for bilat feet pain, left >right.  He says his left foot is painful on the base of the 5th met and is sharp, achy and burning with pressure.  His right foot is the same but also hurts in the heel and arch    35 y.o. male presents with the above complaint. History confirmed with patient.   Objective:  Physical Exam: warm, good capillary refill, no trophic changes or ulcerative lesions, normal DP and PT pulses and normal sensory exam.  Bilateral he has metatarsus adductus, with pain on the base of the fifth metatarsal, minimally reproducible with manual palpation.  He has 5 out of 5 strength.  No evidence of peroneal tendinitis or plantar fasciitis  Radiographs: X-ray of both feet: no fracture, dislocation, swelling or degenerative changes noted, metatarsal adductus noted a Assessment:   1. Metatarsus adductus of both feet      Plan:  Patient was evaluated and treated and all questions answered.  Reviewed radiographs with the patient in detail.  I discussed the etiology and treatment options for metatarsal adductus deformity as well as the pathomechanics.  Recommended support with orthotics.  First we will try prefabricated power steps which he purchased today.  If these are helpful we will consider further padding and eventual custom orthotics in the future.  Return in about 1 month (around 09/20/2020).

## 2020-09-17 ENCOUNTER — Ambulatory Visit: Payer: 59 | Admitting: Podiatry

## 2020-09-17 ENCOUNTER — Encounter: Payer: Self-pay | Admitting: Podiatry

## 2020-09-17 ENCOUNTER — Other Ambulatory Visit: Payer: Self-pay

## 2020-09-17 DIAGNOSIS — Q66222 Congenital metatarsus adductus, left foot: Secondary | ICD-10-CM

## 2020-09-17 DIAGNOSIS — Q66221 Congenital metatarsus adductus, right foot: Secondary | ICD-10-CM

## 2020-09-17 NOTE — Progress Notes (Signed)
  Subjective:  Patient ID: Perry Bender, male    DOB: 05-15-1986,  MRN: [id number]  Chief Complaint  Patient presents with  . Foot Pain    "the orthotics helped but my pinky toe is sticking off the orthotic in my shoe and my foot rolls out more.  The right seems to be worse"    35 y.o. male returns with the above complaint. History confirmed with patient.  Doing much better, the pain at the base is gone.  He does feel like with the uneven ground he slipped off the side of the orthotic especially near his fifth toe  Objective:  Physical Exam: warm, good capillary refill, no trophic changes or ulcerative lesions, normal DP and PT pulses and normal sensory exam.  Bilateral he has metatarsus adductus, today he has no pain on the base of the fifth metatarsal.  He has 5 out of 5 strength.  No evidence of peroneal tendinitis or plantar fasciitis  Radiographs: X-ray of both feet: no fracture, dislocation, swelling or degenerative changes noted, metatarsal adductus noted a Assessment:   1. Metatarsus adductus of both feet      Plan:  Patient was evaluated and treated and all questions answered.  Doing quite well with prefabricated orthoses.  He does feel that he slipped off the edge because he does have a very wide foot.  I checked, however power step does not make an extra width orthotic.  I encouraged him to see if there are other manufacturers that do make this that he could try to see if this works for him.  As far as the feeling of instability on uneven ground I recommended a high top work boot to see if this provides more ankle stability has an orthotic would not help this.  Other than that I think custom orthotics would be helpful for him to get a wider support so he would not be slipping off of it with an accommodative insole.  He will consider this and return if he is ready for custom orthotics  Return if symptoms worsen or fail to improve.

## 2020-10-30 ENCOUNTER — Telehealth: Payer: Self-pay | Admitting: Family Medicine

## 2020-10-30 NOTE — Telephone Encounter (Signed)
E-scribed refills.  Plz schedule lab and cpe visits for additional refills.  

## 2020-10-31 NOTE — Telephone Encounter (Signed)
Called and left detailed message per DPR to call and schedule cpe and labs. EM 

## 2020-11-04 NOTE — Telephone Encounter (Signed)
Patient is having his physical for his job on 11/20/20 . He is wanting to know if he still needs to ours if he sends the records to [place]. Please advise. EM

## 2020-11-05 NOTE — Telephone Encounter (Addendum)
No need for rpt CPE here but do recommend HTN f/u visit after his physical.

## 2020-11-05 NOTE — Telephone Encounter (Signed)
Called and left vm for patient to call us to schedule ov for HTN after his physical .EM

## 2020-12-11 ENCOUNTER — Encounter: Payer: Self-pay | Admitting: Family Medicine

## 2020-12-11 DIAGNOSIS — R9431 Abnormal electrocardiogram [ECG] [EKG]: Secondary | ICD-10-CM

## 2020-12-11 DIAGNOSIS — I10 Essential (primary) hypertension: Secondary | ICD-10-CM

## 2020-12-12 DIAGNOSIS — R9431 Abnormal electrocardiogram [ECG] [EKG]: Secondary | ICD-10-CM | POA: Insufficient documentation

## 2020-12-12 NOTE — Telephone Encounter (Signed)
Replied via mycahrt. Plz notify: I saw your blood pressure was somewhat elevated at your recent employment physical.  I do recommend office visit for hypertension, and we can repeat EKG at that time.  Unfortunately the picture you sent isn't the full EKG tracing. Also, oftentimes the computer interprets findings which aren't really there - that's why it's helpful to have the doctor review the EKG as well. What did the doctor doing your physical say? It would be helpful to have old EKGs to compare.  Let us know sooner if you are having any chest pain or tightness or shortness of breath symptoms

## 2020-12-12 NOTE — Telephone Encounter (Signed)
Pt's wife left a Vm on triage line, in regards to this mychart message they sent. 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 And would like to be sure that Dr. Darnell Level see's this and gives some feedback by the end of the day.

## 2020-12-12 NOTE — Addendum Note (Signed)
Addended by: Ria Bush on: 12/12/2020 05:12 PM   Modules accepted: Orders

## 2020-12-15 ENCOUNTER — Telehealth: Payer: Self-pay

## 2020-12-15 NOTE — Telephone Encounter (Signed)
Authorization received for Echo to be done at [facility] in [place] office but expiration date per website is 01/29/21 but the appointment is not till 02/04/21. Need to contact [facility] insurance to see if they can extend the approval.

## 2020-12-16 ENCOUNTER — Telehealth: Payer: Self-pay

## 2020-12-16 NOTE — Telephone Encounter (Signed)
LVM for pt to contact office. PT needs to be scheduled with Dr. Darnell Level for tomorrow at 2pm and given the last msg from Dr. Darnell Level.

## 2020-12-16 NOTE — Telephone Encounter (Signed)
BP Readings from Last 3 Encounters:  02/11/20 (!) 126/96  09/14/19 134/86  07/26/18 120/90  See mychart message. Markedly elevate blood pressures.  Recommend being seen in office tomorrow. Please schedule tomorrow late AM or PM.

## 2020-12-16 NOTE — Telephone Encounter (Signed)
Received a call from [facility] ECHO department. They can see patient sooner than [place] office. Patient's wife called [facility] and requested to go there. I called patient's insurance and updated this information. Echo is authorized and Marcie Bal with [facility] will re schedule patient and let them know the details.  FYI TO DR Darnell Level

## 2020-12-16 NOTE — Telephone Encounter (Signed)
There is also a mychart message from this am from patient that Dr. Darnell Level responded too if you want to look at that.

## 2020-12-16 NOTE — Telephone Encounter (Signed)
Noted! Thank you

## 2020-12-16 NOTE — Telephone Encounter (Signed)
See recent [employer]. Plz schedule OV for tomorrow late AM or PM

## 2020-12-16 NOTE — Telephone Encounter (Signed)
Noted  

## 2020-12-16 NOTE — Telephone Encounter (Signed)
Pt has been scheduled.  °

## 2020-12-16 NOTE — Telephone Encounter (Signed)
Pt came to clinic today very concerned about his BP which he reports he had checked by his wife at her work with a wrist cuff and it was 189-111. 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 He reports he is to have an ECHO but it is not scheduled until 7/15 and this is also making him very anxious. He is very concerned because he could loose his job if they find out his BP is this elevated. He reported is grandmother had a heart attach in her 20s and a valve replacement in her 53s and he was told he was told his recent EKG showed he has had a recent heart attack. Pt was very anxious in the office. Pt reports he is taking amlodipine, 2.5mg  QD, and has only missed a couple of days. He is limiting caffeine, mountain dew, to 1-2 per day. Pt was allowed to sit in office and relax before blood pressure was taken. BP taken with large BP cuff on R arm. 158/96, HR 76 142/94, HR 71 Pt denied any chest pain, SOB or any distress. Advised pt if he developed any chest pain or SOB to call 911 or go to the ER. Advised PCP is not in the office today but a msg would be sent to him and one of his colleagues and this office will f/u. Advised if anything changed or his BP became elevated again to contact the office. Pt verbalized understanding.

## 2020-12-17 ENCOUNTER — Other Ambulatory Visit: Payer: Self-pay

## 2020-12-17 ENCOUNTER — Ambulatory Visit: Payer: 59 | Admitting: Family Medicine

## 2020-12-17 ENCOUNTER — Encounter: Payer: Self-pay | Admitting: Family Medicine

## 2020-12-17 ENCOUNTER — Telehealth: Payer: Self-pay

## 2020-12-17 VITALS — BP 158/98 | HR 80 | Temp 97.7°F | Ht 66.75 in | Wt 258.3 lb

## 2020-12-17 DIAGNOSIS — R9431 Abnormal electrocardiogram [ECG] [EKG]: Secondary | ICD-10-CM | POA: Diagnosis not present

## 2020-12-17 DIAGNOSIS — Z6841 Body Mass Index (BMI) 40.0 and over, adult: Secondary | ICD-10-CM

## 2020-12-17 DIAGNOSIS — I10 Essential (primary) hypertension: Secondary | ICD-10-CM

## 2020-12-17 DIAGNOSIS — E785 Hyperlipidemia, unspecified: Secondary | ICD-10-CM | POA: Diagnosis not present

## 2020-12-17 DIAGNOSIS — K219 Gastro-esophageal reflux disease without esophagitis: Secondary | ICD-10-CM

## 2020-12-17 MED ORDER — FAMOTIDINE 20 MG PO TABS: 20.0000 mg | ORAL_TABLET | Freq: Every day | ORAL | Status: DC

## 2020-12-17 NOTE — Assessment & Plan Note (Signed)
Chronic, ongoing despite daily PPI + pepcid.  Has already changed to caffeine -free drinks.  Encouraged weight loss.

## 2020-12-17 NOTE — Assessment & Plan Note (Signed)
I don't see evidence for noted old infarcts on EKG. There is left atrial abnormality.  Will await echocardiogram.  He does have upcoming cardiology appointment as well.

## 2020-12-17 NOTE — Telephone Encounter (Signed)
Patient called back. Made patient an appointment on 12/29/20 per Dr. Johnsie Cancel.

## 2020-12-17 NOTE — Assessment & Plan Note (Addendum)
BP remaining elevated over the past week, in setting of increased stress from possible abnormal EKG.  I did recommend slow titration of amlodipine to 5mg  weekly x 1 week then 7.5mg  and finally 10mg  if necessary. Reviewed side effects to monitor.  DASH diet handout provided.  Intolerance to lisinopril and HCTZ in the past.  H/o lightheadedness with HTN over treatment in the past.

## 2020-12-17 NOTE — Patient Instructions (Signed)
Blood pressure is staying elevated - increase amlodipine to 5mg  daily for 1 week then increase to 7.5mg  daily for 1 week and then 10mg  daily assuming tolerated well (no lighteadedness) Your goal blood pressure is <140/90.  Work on low salt/sodium diet - goal <2gm (2000mg ) per day. Eat a diet high in fruits/vegetables and whole grains.  Look into mediterranean and DASH diet. Goal activity is 127min/wk of moderate intensity exercise.  This can be split into 30 minute chunks.  If you are not at this level, you can start with smaller 10-15 min increments and slowly build up activity. Look at [place] and Olmito.org for more resources   [email or web address]">  DASH Eating Plan DASH stands for Dietary Approaches to Stop Hypertension. The DASH eating plan is a healthy eating plan that has been shown to: Reduce high blood pressure (hypertension). Reduce your risk for type 2 diabetes, heart disease, and stroke. Help with weight loss. What are tips for following this plan? Reading food labels Check food labels for the amount of salt (sodium) per serving. Choose foods with less than 5 percent of the Daily Value of sodium. Generally, foods with less than 300 milligrams (mg) of sodium per serving fit into this eating plan. To find whole grains, look for the word "whole" as the first word in the ingredient list. Shopping Buy products labeled as "low-sodium" or "no salt added." Buy fresh foods. Avoid canned foods and pre-made or frozen meals. Cooking Avoid adding salt when cooking. Use salt-free seasonings or herbs instead of table salt or sea salt. Check with your health care provider or pharmacist before using salt substitutes. Do not fry foods. Cook foods using healthy methods such as baking, boiling, grilling, roasting, and broiling instead. Cook with heart-healthy oils, such as olive, canola, avocado, soybean, or sunflower oil. Meal planning  Eat a balanced diet that  includes: 4 or more servings of fruits and 4 or more servings of vegetables each day. Try to fill one-half of your plate with fruits and vegetables. 6-8 servings of whole grains each day. Less than 6 oz (170 g) of lean meat, poultry, or fish each day. A 3-oz (85-g) serving of meat is about the same size as a deck of cards. One egg equals 1 oz (28 g). 2-3 servings of low-fat dairy each day. One serving is 1 cup (237 mL). 1 serving of nuts, seeds, or beans 5 times each week. 2-3 servings of heart-healthy fats. Healthy fats called omega-3 fatty acids are found in foods such as walnuts, flaxseeds, fortified milks, and eggs. These fats are also found in cold-water fish, such as sardines, salmon, and mackerel. Limit how much you eat of: Canned or prepackaged foods. Food that is high in trans fat, such as some fried foods. Food that is high in saturated fat, such as fatty meat. Desserts and other sweets, sugary drinks, and other foods with added sugar. Full-fat dairy products. Do not salt foods before eating. Do not eat more than 4 egg yolks a week. Try to eat at least 2 vegetarian meals a week. Eat more home-cooked food and less restaurant, buffet, and fast food.  Lifestyle When eating at a restaurant, ask that your food be prepared with less salt or no salt, if possible. If you drink alcohol: Limit how much you use to: 0-1 drink a day for women who are not pregnant. 0-2 drinks a day for men. Be aware of how much alcohol is in your drink. In the [place] one drink equals one 12  oz bottle of beer (355 mL), one 5 oz glass of wine (148 mL), or one 1 oz glass of hard liquor (44 mL). General information Avoid eating more than 2,300 mg of salt a day. If you have hypertension, you may need to reduce your sodium intake to 1,500 mg a day. Work with your health care provider to maintain a healthy body weight or to lose weight. Ask what an ideal weight is for you. Get at least 30 minutes of exercise that  causes your heart to beat faster (aerobic exercise) most days of the week. Activities may include walking, swimming, or biking. Work with your health care provider or dietitian to adjust your eating plan to your individual calorie needs. What foods should I eat? Fruits All fresh, dried, or frozen fruit. Canned fruit in natural juice (without addedsugar). Vegetables Fresh or frozen vegetables (raw, steamed, roasted, or grilled). Low-sodium or reduced-sodium tomato and vegetable juice. Low-sodium or reduced-sodium tomatosauce and tomato paste. Low-sodium or reduced-sodium canned vegetables. Grains Whole-grain or whole-wheat bread. Whole-grain or whole-wheat pasta. Brown rice. [place]. Bulgur. Whole-grain and low-sodium cereals. Pita bread.Low-fat, low-sodium crackers. Whole-wheat flour tortillas. Meats and other proteins Skinless chicken or [place]. Ground chicken or [place]. Pork with fat trimmed off. Fish and seafood. Egg whites. Dried beans, peas, or lentils. Unsalted nuts, nut butters, and seeds. Unsalted canned beans. Lean cuts of beef with fat trimmed off. Low-sodium, lean precooked or cured meat, such as sausages or meatloaves. Dairy Low-fat (1%) or fat-free (skim) milk. Reduced-fat, low-fat, or fat-free cheeses. Nonfat, low-sodium ricotta or cottage cheese. Low-fat or nonfatyogurt. Low-fat, low-sodium cheese. Fats and oils Soft margarine without trans fats. Vegetable oil. Reduced-fat, low-fat, or light mayonnaise and salad dressings (reduced-sodium). Canola, safflower, olive, avocado, soybean, andsunflower oils. Avocado. Seasonings and condiments Herbs. Spices. Seasoning mixes without salt. Other foods Unsalted popcorn and pretzels. Fat-free sweets. The items listed above may not be a complete list of foods and beverages you can eat. Contact a dietitian for more information. What foods should I avoid? Fruits Canned fruit in a light or heavy syrup. Fried fruit. Fruit in cream or  buttersauce. Vegetables Creamed or fried vegetables. Vegetables in a cheese sauce. Regular canned vegetables (not low-sodium or reduced-sodium). Regular canned tomato sauce and paste (not low-sodium or reduced-sodium). Regular tomato and vegetable juice(not low-sodium or reduced-sodium). Angie Fava. Olives. Grains Baked goods made with fat, such as croissants, muffins, or some breads. Drypasta or rice meal packs. Meats and other proteins Fatty cuts of meat. Ribs. Fried meat. Berniece Salines. Bologna, salami, and other precooked or cured meats, such as sausages or meat loaves. Fat from the back of a pig (fatback). Bratwurst. Salted nuts and seeds. Canned beans with added salt. Canned orsmoked fish. Whole eggs or egg yolks. Chicken or [place] with skin. Dairy Whole or 2% milk, cream, and half-and-half. Whole or full-fat cream cheese. Whole-fat or sweetened yogurt. Full-fat cheese. Nondairy creamers. Whippedtoppings. Processed cheese and cheese spreads. Fats and oils Butter. Stick margarine. Lard. Shortening. Ghee. Bacon fat. Tropical oils, suchas coconut, palm kernel, or palm oil. Seasonings and condiments Onion salt, garlic salt, seasoned salt, table salt, and sea salt. Worcestershire sauce. Tartar sauce. Barbecue sauce. Teriyaki sauce. Soy sauce, including reduced-sodium. Steak sauce. Canned and packaged gravies. Fish sauce. Oyster sauce. Cocktail sauce. Store-bought horseradish. Ketchup. Mustard. Meat flavorings and tenderizers. Bouillon cubes. Hot sauces. Pre-made or packaged marinades. Pre-made or packaged taco seasonings. Relishes. Regular saladdressings. Other foods Salted popcorn and pretzels. The items listed above may not be a  complete list of foods and beverages you should avoid. Contact a dietitian for more information. Where to find more information National Heart, Lung, and Blood Institute: https://wilson-eaton.com/ American Heart Association: www.heart.org Academy of Nutrition and Dietetics:  www.eatright.Gateway: www.kidney.org Summary The DASH eating plan is a healthy eating plan that has been shown to reduce high blood pressure (hypertension). It may also reduce your risk for type 2 diabetes, heart disease, and stroke. When on the DASH eating plan, aim to eat more fresh fruits and vegetables, whole grains, lean proteins, low-fat dairy, and heart-healthy fats. With the DASH eating plan, you should limit salt (sodium) intake to 2,300 mg a day. If you have hypertension, you may need to reduce your sodium intake to 1,500 mg a day. Work with your health care provider or dietitian to adjust your eating plan to your individual calorie needs. This information is not intended to replace advice given to you by your health care provider. Make sure you discuss any questions you have with your healthcare provider. Document Revised: 05/11/2019 Document Reviewed: 05/11/2019 Elsevier Patient Education  2022 [employer].

## 2020-12-17 NOTE — Assessment & Plan Note (Signed)
Mild by latest labs through [facility] (LDL 130s), not on medication.

## 2020-12-17 NOTE — Assessment & Plan Note (Addendum)
Reviewed relation of obesity to hypertension and worsening reflux, encouraged ongoing efforts towards sustainable weight loss.  Denies OSA symptoms.

## 2020-12-17 NOTE — Progress Notes (Signed)
Patient ID: Perry Bender, male    DOB: April 19, 1986, 35 y.o.   MRN: [id number]  This visit was conducted in person.  BP (!) 158/98 (BP Location: Right Arm, Cuff Size: Large)   Pulse 80   Temp 97.7 F (36.5 C) (Temporal)   Ht 5' 6.75" (1.695 m)   Wt 258 lb 5 oz (117.2 kg)   SpO2 97%   BMI 40.76 kg/m   BP Readings from Last 3 Encounters:  12/17/20 (!) 158/98  02/11/20 (!) 126/96  09/14/19 134/86    CC: HTN  Subjective:   HPI: Perry Bender is a 35 y.o. male presenting on 12/17/2020 for Hypertension (C/o higher than normal BP readings.  Scheduled for ECHO on 01/02/21.)   I last saw patient 08/2019. See recent mychart messages.  Recent employment physical with elevated blood pressures and abnormal EKG. Labs reviewed through [facility]. FLP with LDL 132. TSH, CMP, CBC normal.  Has scheduled echocardiogram 12/24/2020 and cardiology appt 12/29/2020.   HTN - longstanding h/o hypertension, compliant with current antihypertensive regimen of amlodipine 2.5mg  daily. Intolerant of hctz (HA) and lisinopril (severe leg cramping) when previously tried. Does check blood pressures at home: high at home recently - 170-180/100-110s. No low blood pressure readings or symptoms of dizziness/syncope. Denies vision changes, CP/tightness, SOB, leg swelling. Mild sinus pressure HA this morning after trimming bushes yesterday.   Has changed to caffeine-free drinks.  Overall feels well.  Limits NSAID use.  He doesn't use extra salt.   Quit smoking 09/2020.  Weight gain noted since then.   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   Has been worried about EKG findings and blood pressures  No significant snoring, no witnessed apnea, no daytime somnolence. Endorses restorative sleep.      Relevant past medical, surgical, family and social history reviewed and updated as indicated. Interim medical history since our last visit  reviewed. Allergies and medications reviewed and updated. Outpatient Medications Prior to Visit  Medication Sig Dispense Refill   clindamycin (CLEOCIN T) 1 % external solution Apply 1 application topically 2 (two) times daily. Appy on the skin twice daily     fluticasone (FLONASE) 50 MCG/ACT nasal spray USE 2 SPRAYS INTO BOTH NOSTRILS DAILY--NEEDS ANNUAL PHYSICAL APPT FOR FURTHER REFILLS 16 mL 2   pantoprazole (PROTONIX) 40 MG tablet TAKE 1 TABLET BY MOUTH EVERY DAY 90 tablet 0   amLODipine (NORVASC) 2.5 MG tablet TAKE 1 TABLET BY MOUTH EVERY DAY 90 tablet 0   triamcinolone (NASACORT) 55 MCG/ACT AERO nasal inhaler Place 2 sprays into the nose daily. 1 Inhaler 6   amLODipine (NORVASC) 2.5 MG tablet Take 2 tablets (5 mg total) by mouth daily.     No facility-administered medications prior to visit.     Per HPI unless specifically indicated in ROS section below Review of Systems  Objective:  BP (!) 158/98 (BP Location: Right Arm, Cuff Size: Large)   Pulse 80   Temp 97.7 F (36.5 C) (Temporal)   Ht 5' 6.75" (1.695 m)   Wt 258 lb 5 oz (117.2 kg)   SpO2 97%   BMI 40.76 kg/m   Wt Readings from Last 3 Encounters:  12/17/20 258 lb 5 oz (117.2 kg)  02/11/20 245 lb 7 oz (111.3 kg)  09/14/19 254 lb 7 oz (115.4 kg)      Physical Exam Vitals and nursing note reviewed.  Constitutional:  Appearance: Normal appearance. He is obese. He is not ill-appearing.  Cardiovascular:     Rate and Rhythm: Normal rate and regular rhythm.     Pulses: Normal pulses.     Heart sounds: Normal heart sounds. No murmur heard. Pulmonary:     Effort: Pulmonary effort is normal. No respiratory distress.     Breath sounds: Normal breath sounds. No wheezing, rhonchi or rales.  Musculoskeletal:     Right lower leg: No edema.     Left lower leg: No edema.  Skin:    General: Skin is warm and dry.     Findings: No rash.  Neurological:     Mental Status: He is alert.  Psychiatric:        Mood and Affect:  Mood normal.        Behavior: Behavior normal.      Results for orders placed or performed in visit on 03/21/19  Novel Coronavirus, NAA ([facility])   Specimen: Oropharyngeal(OP) collection in vial transport medium   OROPHARYNGEA  TESTING  Result Value Ref Range   SARS-CoV-2, NAA Not Detected Not Detected   EKG from 11/20/2020 reviewed - NSR rate 60s, normal axis, intervals, no acute ST/T changes. LAE ?p mitrale. I don't see anterior or inferior infarct.   Assessment & Plan:  This visit occurred during the SARS-CoV-2 public health emergency.  Safety protocols were in place, including screening questions prior to the visit, additional usage of staff PPE, and extensive cleaning of exam room while observing appropriate contact time as indicated for disinfecting solutions.   Problem List Items Addressed This Visit     HTN (hypertension) - Primary    BP remaining elevated over the past week, in setting of increased stress from possible abnormal EKG.  I did recommend slow titration of amlodipine to 5mg  weekly x 1 week then 7.5mg  and finally 10mg  if necessary. Reviewed side effects to monitor.  DASH diet handout provided.  Intolerance to lisinopril and HCTZ in the past.  H/o lightheadedness with HTN over treatment in the past.        Relevant Medications   amLODipine (NORVASC) 2.5 MG tablet   Dyslipidemia    Mild by latest labs through [facility] (LDL 130s), not on medication.        Morbid obesity with BMI of 40.0-44.9, adult ([place])    Reviewed relation of obesity to hypertension and worsening reflux, encouraged ongoing efforts towards sustainable weight loss.  Denies OSA symptoms.        GERD (gastroesophageal reflux disease)    Chronic, ongoing despite daily PPI + pepcid.  Has already changed to caffeine -free drinks.  Encouraged weight loss.        Relevant Medications   famotidine (PEPCID) 20 MG tablet   Abnormal EKG    I don't see evidence for noted old infarcts on EKG. There is  left atrial abnormality.  Will await echocardiogram.  He does have upcoming cardiology appointment as well.         Meds ordered this encounter  Medications   famotidine (PEPCID) 20 MG tablet    Sig: Take 1 tablet (20 mg total) by mouth at bedtime.   No orders of the defined types were placed in this encounter.   Patient instructions: Blood pressure is staying elevated - increase amlodipine to 5mg  daily for 1 week then increase to 7.5mg  daily for 1 week and then 10mg  daily assuming tolerated well (no lighteadedness) Your goal blood pressure is <140/90.  Work on  low salt/sodium diet - goal <2gm (2000mg ) per day. Eat a diet high in fruits/vegetables and whole grains.  Look into mediterranean and DASH diet. Goal activity is 166min/wk of moderate intensity exercise.  This can be split into 30 minute chunks.  If you are not at this level, you can start with smaller 10-15 min increments and slowly build up activity. Look at Dexter.org for more resources   Follow up plan: Return in about 2 months (around 02/16/2021) for annual exam, prior fasting for blood work.  Ria Bush, MD

## 2020-12-17 NOTE — Telephone Encounter (Signed)
Called patient to set up appointment and make sure he is getting an echocardiogram this week or next. Left message for patient to call back.

## 2020-12-22 NOTE — Progress Notes (Signed)
CARDIOLOGY CONSULT NOTE       Patient ID: Perry Bender MRN: [id number] DOB/AGE: 11/22/1985 35 y.o.  Referring Physician: Danise Mina Primary Physician: Ria Bush, MD Primary Cardiologist: New Reason for Consultation: Abnormal ECG   Active Problems:   * No active hospital problems. *   HPI:  35 y.o. referred by Dr Darnell Level for abnormal ECG His mom works with Dr Ronnald Ramp as office manager at [employer] He is a Agricultural consultant Has had some HTN. Has been out of work until cardiology evaluation BP inprimary office 12/17/20 was elevate 158/98 mmHg This has been Rx by primary for a while Notes intolerance to HCTZ with headache and lisinopril with leg cramps Is only on very low dose of norvasc Quit smoking in April 2022   His Norvasc dose has been increased  Needs to lose weight Contributes to GERD daily PPI/pepcid   Lab review 11/20/20 showed LDL 132   ECG 10/01/19 SR rate 58 insignificant Q lead 3 LAE ECG 11/20/20  SR rate 65 insignificant Q lead 3,F LAE poor R wave progression  Suspect any minor changes from lead placement Doubt infarct in inferior or anterior walls   Echo done 12/24/20 showed no RWMA with EF 65-70% mild LVH mild aortic root dilatation 3.9 cm No significant valve disease   His weight is up since he stopped smoking in February Diet poor 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  He indicates being intolerant to lisinopril with bad leg cramps and headaches with diuretics  ROS All other systems reviewed and negative except as noted above  Past Medical History:  Diagnosis Date   Dyslipidemia 2014   Family history of malignant hyperthermia 2016   paternal uncle and cousin   GERD (gastroesophageal reflux disease)    HTN (hypertension) 2012   Transaminitis 2014    Family History  Problem Relation Age of Onset   Thyroid cancer Mother    Cancer Father        carcinoid tumor   Diabetes Father    Lymphoma Father        large B cell (NHL), bone and lung    CAD Maternal Grandmother 62       CABG x2, multiple stents   Congestive Heart Failure Maternal Grandmother    Irritable bowel syndrome Maternal Grandmother    Valvular heart disease Maternal Grandmother 42       aortic valve replacement   Diabetes Maternal Grandfather    Irritable bowel syndrome Maternal Grandfather    Prostate cancer Maternal Grandfather    Congestive Heart Failure Maternal Grandfather    Atrial fibrillation Maternal Grandfather    Stroke Paternal Grandfather    Malignant hyperthermia Paternal Uncle        uncle can't eat red med   CAD Paternal Uncle    Malignant hyperthermia Cousin     Social History   Socioeconomic History   Marital status: Single    Spouse name: Not on file   Number of children: 1   Years of education: Not on file   Highest education level: Not on file  Occupational History   Not on file  Tobacco Use   Smoking status: Former    Pack years: 0.00    Types: Cigarettes    Quit date: 08/25/2020    Years since quitting: 0.3   Smokeless tobacco: Never  Substance and Sexual Activity   Alcohol use: Yes    Comment: Occasional   Drug use: No   Sexual activity:  Not on file  Other Topics Concern   Not on file  Social History Narrative   Lives alone, 1 dog   Occupation: Works as Chief Financial Officer for [employer], previously Oceanographer care    Edu: HS   Activity: mows yards   Diet: good water, fruits/vegetables daily, herbalife   Social Determinants of Radio broadcast assistant Strain: Not on file  Food Insecurity: Not on file  Transportation Needs: Not on file  Physical Activity: Not on file  Stress: Not on file  Social Connections: Not on file  Intimate Partner Violence: Not on file    Past Surgical History:  Procedure Laterality Date   WISDOM TOOTH EXTRACTION  2009      Current Outpatient Medications:    amLODipine (NORVASC) 2.5 MG tablet, Take 2 tablets (5 mg total) by mouth daily., Disp: , Rfl:    clindamycin (CLEOCIN  T) 1 % external solution, Apply 1 application topically 2 (two) times daily. Appy on the skin twice daily, Disp: , Rfl:    famotidine (PEPCID) 20 MG tablet, Take 1 tablet (20 mg total) by mouth at bedtime., Disp: , Rfl:    fluticasone (FLONASE) 50 MCG/ACT nasal spray, USE 2 SPRAYS INTO BOTH NOSTRILS DAILY--NEEDS ANNUAL PHYSICAL APPT FOR FURTHER REFILLS, Disp: 16 mL, Rfl: 2   pantoprazole (PROTONIX) 40 MG tablet, TAKE 1 TABLET BY MOUTH EVERY DAY, Disp: 90 tablet, Rfl: 0    Physical Exam: There were no vitals taken for this visit.    Affect appropriate Healthy:  appears stated age 35: normal Neck supple with no adenopathy JVP normal no bruits no thyromegaly Lungs clear with no wheezing and good diaphragmatic motion Heart:  S1/S2 no murmur, no rub, gallop or click PMI normal Abdomen: benighn, BS positve, no tenderness, no AAA no bruit.  No HSM or HJR Distal pulses intact with no bruits No edema Neuro non-focal Skin warm and dry No muscular weakness   Labs:   Lab Results  Component Value Date   WBC 7.4 07/18/2018   HGB 16.1 07/18/2018   HCT 46.9 07/18/2018   MCV 91.3 07/18/2018   PLT 317.0 07/18/2018   No results for input(s): NA, K, CL, CO2, BUN, CREATININE, CALCIUM, PROT, BILITOT, ALKPHOS, ALT, AST, GLUCOSE in the last 168 hours.  Invalid input(s): LABALBU No results found for: CKTOTAL, CKMB, CKMBINDEX, TROPONINI  Lab Results  Component Value Date   CHOL 184 09/29/2017   CHOL 162 01/14/2015   CHOL 186 03/15/2013   Lab Results  Component Value Date   HDL 27.20 (L) 09/29/2017   HDL 26.90 (L) 01/14/2015   HDL 25 (A) 03/15/2013   Lab Results  Component Value Date   LDLCALC 138 (H) 09/29/2017   LDLCALC 111 (H) 01/14/2015   LDLCALC 122 03/15/2013   Lab Results  Component Value Date   TRIG 95.0 09/29/2017   TRIG 122.0 01/14/2015   TRIG 193 03/15/2013   Lab Results  Component Value Date   CHOLHDL 7 09/29/2017   CHOLHDL 6 01/14/2015   No results found for:  LDLDIRECT    Radiology: No results found.  EKG: see HPI    ASSESSMENT AND PLAN:   HTN:  not well controlled as evidenced by LVH, LAE and aortic root dilatation on echo f/u with primary to consider 2 nd drug. Intolerant to lisinopril/HCTZ consider alternative diuretic or hydralazine  Abnormal ECG:  LAE seems likely but not infarct Q wave lead 3,F not significant May reflect HTN Or lead placement  as does poor R wave progression on current Echo with no RWMA He has not had An old MI GERD:  PPI/pepcid low carb diet and weight loss CAD:  risk stratify with coronary calcium score  Letter written to return to full duty at work F/U Dr Darnell Level for BP management F/U cards PRN if calcium score 0   Echo done Coronary calcium score  Signed: Jenkins Rouge 12/22/2020, 10:45 AM

## 2020-12-23 ENCOUNTER — Telehealth: Payer: Self-pay

## 2020-12-23 NOTE — Telephone Encounter (Signed)
ERROR

## 2020-12-24 ENCOUNTER — Other Ambulatory Visit: Payer: Self-pay

## 2020-12-24 ENCOUNTER — Ambulatory Visit
Admission: RE | Admit: 2020-12-24 | Discharge: 2020-12-24 | Disposition: A | Discharge status: Home / Self Care | Payer: 59 | Source: Ambulatory Visit | Attending: Family Medicine | Admitting: Family Medicine

## 2020-12-24 DIAGNOSIS — I10 Essential (primary) hypertension: Secondary | ICD-10-CM | POA: Diagnosis not present

## 2020-12-24 DIAGNOSIS — R9431 Abnormal electrocardiogram [ECG] [EKG]: Secondary | ICD-10-CM | POA: Diagnosis present

## 2020-12-24 LAB — ECHOCARDIOGRAM COMPLETE
AR max vel: 4.04 cm2
AV Area VTI: 4.54 cm2
AV Area mean vel: 4.01 cm2
AV Mean grad: 2 mmHg
AV Peak grad: 3.7 mmHg
Ao pk vel: 0.96 m/s
Area-P 1/2: 3.45 cm2
S' Lateral: 2.33 cm

## 2020-12-24 NOTE — Progress Notes (Signed)
*  PRELIMINARY RESULTS* Echocardiogram 2D Echocardiogram has been performed.  Sherrie Sport 12/24/2020, 9:16 AM

## 2020-12-25 ENCOUNTER — Encounter: Payer: Self-pay | Admitting: Family Medicine

## 2020-12-25 DIAGNOSIS — I77810 Thoracic aortic ectasia: Secondary | ICD-10-CM | POA: Insufficient documentation

## 2020-12-29 ENCOUNTER — Ambulatory Visit (INDEPENDENT_AMBULATORY_CARE_PROVIDER_SITE_OTHER)
Admission: RE | Admit: 2020-12-29 | Discharge: 2020-12-29 | Disposition: A | Discharge status: Home / Self Care | Payer: 59 | Source: Ambulatory Visit | Attending: Cardiovascular Disease | Admitting: Cardiovascular Disease

## 2020-12-29 ENCOUNTER — Other Ambulatory Visit: Payer: Self-pay

## 2020-12-29 ENCOUNTER — Ambulatory Visit: Payer: 59 | Admitting: Cardiovascular Disease

## 2020-12-29 ENCOUNTER — Encounter: Payer: Self-pay | Admitting: Cardiovascular Disease

## 2020-12-29 VITALS — BP 138/90 | HR 88 | Ht 67.0 in | Wt 259.0 lb

## 2020-12-29 DIAGNOSIS — I10 Essential (primary) hypertension: Secondary | ICD-10-CM

## 2020-12-29 DIAGNOSIS — K219 Gastro-esophageal reflux disease without esophagitis: Secondary | ICD-10-CM | POA: Diagnosis not present

## 2020-12-29 DIAGNOSIS — R9431 Abnormal electrocardiogram [ECG] [EKG]: Secondary | ICD-10-CM | POA: Diagnosis not present

## 2020-12-29 NOTE — Patient Instructions (Addendum)
Medication Instructions:  Your physician recommends that you continue on your current medications as directed. Please refer to the Current Medication list given to you today.  *If you need a refill on your cardiac medications before your next appointment, please call your pharmacy*  Lab Work: If you have labs (blood work) drawn today and your tests are completely normal, you will receive your results only by: [place] (if you have MyChart) OR A paper copy in the mail If you have any lab test that is abnormal or we need to change your treatment, we will call you to review the results.  Testing/Procedures: Cardiac CT scanning for calcium score, (CAT scanning), is a noninvasive, special x-ray that produces cross-sectional images of the body using x-rays and a computer. CT scans help physicians diagnose and treat medical conditions. For some CT exams, a contrast material is used to enhance visibility in the area of the body being studied. CT scans provide greater clarity and reveal more details than regular x-ray exams.  Follow-Up: At [facility], you and your health needs are our priority.  As part of our continuing mission to provide you with exceptional heart care, we have created designated Provider Care Teams.  These Care Teams include your primary Cardiologist (physician) and Advanced Practice Providers (APPs -  Physician Assistants and Nurse Practitioners) who all work together to provide you with the care you need, when you need it.  We recommend signing up for the patient portal called "MyChart".  Sign up information is provided on this After Visit Summary.  MyChart is used to connect with patients for Virtual Visits (Telemedicine).  Patients are able to view lab/test results, encounter notes, upcoming appointments, etc.  Non-urgent messages can be sent to your provider as well.   To learn more about what you can do with MyChart, go to [email or web address].    Your next  appointment:   As needed  The format for your next appointment:   In Person  Provider:   You may see Dr. Johnsie Cancel or one of the following Advanced Practice Providers on your designated Care Team:   Cecilie Kicks, NP

## 2021-01-02 ENCOUNTER — Ambulatory Visit: Payer: 59

## 2021-01-05 MED ORDER — AMLODIPINE BESYLATE 10 MG PO TABS: 10.0000 mg | ORAL_TABLET | Freq: Every day | ORAL | 1 refills | Status: DC

## 2021-01-05 NOTE — Addendum Note (Signed)
Addended by: Ria Bush on: 01/05/2021 05:29 PM   Modules accepted: Orders

## 2021-01-23 ENCOUNTER — Other Ambulatory Visit: Payer: Self-pay

## 2021-01-23 ENCOUNTER — Encounter: Payer: Self-pay | Admitting: Family Medicine

## 2021-01-23 ENCOUNTER — Ambulatory Visit: Payer: 59 | Admitting: Family Medicine

## 2021-01-23 VITALS — BP 146/82 | HR 83 | Temp 97.7°F | Ht 67.0 in | Wt 260.4 lb

## 2021-01-23 DIAGNOSIS — K760 Fatty (change of) liver, not elsewhere classified: Secondary | ICD-10-CM

## 2021-01-23 DIAGNOSIS — R9431 Abnormal electrocardiogram [ECG] [EKG]: Secondary | ICD-10-CM

## 2021-01-23 DIAGNOSIS — I10 Essential (primary) hypertension: Secondary | ICD-10-CM

## 2021-01-23 DIAGNOSIS — Z6841 Body Mass Index (BMI) 40.0 and over, adult: Secondary | ICD-10-CM

## 2021-01-23 MED ORDER — AMLODIPINE BESY-BENAZEPRIL HCL 5-10 MG PO CAPS: 1.0000 | ORAL_CAPSULE | Freq: Every day | ORAL | 3 refills | Status: DC

## 2021-01-23 NOTE — Assessment & Plan Note (Signed)
Encouraged building on small incremental changes to affect sustainable weight loss.

## 2021-01-23 NOTE — Assessment & Plan Note (Addendum)
Discussed this finding. Encouraged ongoing weight loss efforts. LFTs previously normal.

## 2021-01-23 NOTE — Assessment & Plan Note (Addendum)
Chronic, improving but still above goal. Now with pedal edema side effect due to full dose amlodipine, will drop dose to '5mg'$  daily, add benazepril '10mg'$  daily - pt willing to try despite lisinopril intolerance (cramping). I provided him with BP log card to monitor pressures at home and asked him to drop off over the next few weeks.  RTC 3 mo HT f/u visit.

## 2021-01-23 NOTE — Patient Instructions (Addendum)
Stop amlodipine '10mg'$ . Start in its place Lotrel (amlodipine/benazepril) '5mg'$ /'10mg'$  one tablet daily.  Keep [place] updated on blood pressures and leg swelling.  BP log card provided today.  Return in 2-3 months for BP f/u visit

## 2021-01-23 NOTE — Assessment & Plan Note (Signed)
Reassuring cardiac evaluation 12/2020

## 2021-01-23 NOTE — Progress Notes (Signed)
Patient ID: Perry Bender, male    DOB: February 12, 1986, 35 y.o.   MRN: [id number]  This visit was conducted in person.  BP (!) 146/82 (BP Location: Right Arm, Cuff Size: Large)   Pulse 83   Temp 97.7 F (36.5 C) (Temporal)   Ht '5\' 7"'$  (1.702 m)   Wt 260 lb 6 oz (118.1 kg)   SpO2 95%   BMI 40.78 kg/m    CC: HTN f/u visit, leg swelling Subjective:   HPI: Perry Bender is a 35 y.o. male presenting on 01/23/2021 for Joint Swelling (? From Amlodipine) and Weight Gain   Reassuring echocardiogram 12/2020.  Saw cardiology Johnsie Cancel) with also reassuring evaluation - cardiac calcium score of 0 (12/29/2020). Cardiac CT incidentally showed fatty liver.   Quit smoking 07/2020 with resultant weight gain.  He has stopped caffeine over the last month.   HTN - Compliant with current antihypertensive regimen of amlodipine '10mg'$  daily - this dose was increased over the past month due to uncontrolled hypertension. Has not recently checked blood pressures at home: previously running 140/90. Notes leg swelling since increasing dose with tight feeling tl egs. No low blood pressure readings or symptoms of dizziness/syncope.  Denies HA, vision changes, CP/tightness, SOB, leg swelling.       Relevant past medical, surgical, family and social history reviewed and updated as indicated. Interim medical history since our last visit reviewed. Allergies and medications reviewed and updated. Outpatient Medications Prior to Visit  Medication Sig Dispense Refill   clindamycin (CLEOCIN T) 1 % external solution Apply 1 application topically 2 (two) times daily. Appy on the skin twice daily     famotidine (PEPCID) 20 MG tablet Take 1 tablet (20 mg total) by mouth at bedtime.     fluticasone (FLONASE) 50 MCG/ACT nasal spray USE 2 SPRAYS INTO BOTH NOSTRILS DAILY--NEEDS ANNUAL PHYSICAL APPT FOR FURTHER REFILLS 16 mL 2   pantoprazole (PROTONIX) 40 MG tablet TAKE 1 TABLET BY MOUTH EVERY DAY 90 tablet 0   amLODipine (NORVASC)  10 MG tablet Take 1 tablet (10 mg total) by mouth daily. 90 tablet 1   No facility-administered medications prior to visit.     Per HPI unless specifically indicated in ROS section below Review of Systems  Objective:  BP (!) 146/82 (BP Location: Right Arm, Cuff Size: Large)   Pulse 83   Temp 97.7 F (36.5 C) (Temporal)   Ht '5\' 7"'$  (1.702 m)   Wt 260 lb 6 oz (118.1 kg)   SpO2 95%   BMI 40.78 kg/m   Wt Readings from Last 3 Encounters:  01/23/21 260 lb 6 oz (118.1 kg)  12/29/20 259 lb (117.5 kg)  12/17/20 258 lb 5 oz (117.2 kg)      Physical Exam Vitals and nursing note reviewed.  Constitutional:      Appearance: Normal appearance. He is obese. He is not ill-appearing.  Neck:     Thyroid: No thyroid mass or thyromegaly.  Cardiovascular:     Rate and Rhythm: Normal rate and regular rhythm.     Pulses: Normal pulses.     Heart sounds: Normal heart sounds. No murmur heard. Pulmonary:     Effort: Pulmonary effort is normal. No respiratory distress.     Breath sounds: Normal breath sounds. No wheezing, rhonchi or rales.  Musculoskeletal:        General: Swelling present.     Right lower leg: Edema (1+) present.     Left lower leg: Edema (  1+) present.     Comments: Pitting edema at bilateral ankles  Skin:    General: Skin is warm and dry.  Neurological:     Mental Status: He is alert.  Psychiatric:        Mood and Affect: Mood normal.        Behavior: Behavior normal.      Lab Results  Component Value Date   CREATININE 0.96 07/18/2018   BUN 11 07/18/2018   NA 139 07/18/2018   K 4.7 07/18/2018   CL 101 07/18/2018   CO2 30 07/18/2018    Lab Results  Component Value Date   ALT 45 07/18/2018   AST 22 07/18/2018   ALKPHOS 66 07/18/2018   BILITOT 0.5 07/18/2018    Echocardiogram 12/2020 - WNL, EF 65-70%, normal wall motion, mild LVH, normal RV, poorly visualized AV, mild aortic root dilatation 42m   Assessment & Plan:  This visit occurred during the SARS-CoV-2  public health emergency.  Safety protocols were in place, including screening questions prior to the visit, additional usage of staff PPE, and extensive cleaning of exam room while observing appropriate contact time as indicated for disinfecting solutions.   Problem List Items Addressed This Visit     HTN (hypertension) - Primary    Chronic, improving but still above goal. Now with pedal edema side effect due to full dose amlodipine, will drop dose to '5mg'$  daily, add benazepril '10mg'$  daily - pt willing to try despite lisinopril intolerance (cramping). I provided him with BP log card to monitor pressures at home and asked him to drop off over the next few weeks.  RTC 3 mo HT f/u visit.        Relevant Medications   amLODipine-benazepril (LOTREL) 5-10 MG capsule   Morbid obesity with BMI of 40.0-44.9, adult ([place]    Encouraged building on small incremental changes to affect sustainable weight loss.        Abnormal EKG    Reassuring cardiac evaluation 12/2020       Fatty liver    Discussed this finding. Encouraged ongoing weight loss efforts. LFTs previously normal.          Meds ordered this encounter  Medications   amLODipine-benazepril (LOTREL) 5-10 MG capsule    Sig: Take 1 capsule by mouth daily.    Dispense:  30 capsule    Refill:  3    To replace amlodipine '10mg'$     No orders of the defined types were placed in this encounter.   Patient Instructions  Stop amlodipine '10mg'$ . Start in its place Lotrel (amlodipine/benazepril) '5mg'$ /'10mg'$  one tablet daily.  Keep [place] on blood pressures and leg swelling.  BP log card provided today.  Return in 2-3 months for BP f/u visit   Follow up plan: Return in about 3 months (around 04/25/2021) for follow up visit.  JRia Bush MD

## 2021-02-04 ENCOUNTER — Other Ambulatory Visit: Payer: 59

## 2021-02-20 ENCOUNTER — Other Ambulatory Visit: Payer: Self-pay | Admitting: Family Medicine

## 2021-02-20 NOTE — Telephone Encounter (Signed)
E-scribed refill.  Plz schedule 3 mo HTN f/u (around 2nd wk of November).

## 2021-02-27 ENCOUNTER — Telehealth: Payer: 59 | Admitting: Podiatry

## 2021-02-27 DIAGNOSIS — Q66221 Congenital metatarsus adductus, right foot: Secondary | ICD-10-CM

## 2021-02-27 NOTE — Telephone Encounter (Signed)
Patient's significant other, Perry Bender, came by the office and purchased another pair of Powersteps for Perry Bender.

## 2021-02-27 NOTE — Telephone Encounter (Signed)
Perry Bender pts wife left message asking if she could come purchase another pair of the inserts from our office.   Upon checking they were power steps and when I called she was already on her way to the office. PT wears a size 11 shoe.

## 2021-03-05 ENCOUNTER — Other Ambulatory Visit: Payer: Self-pay | Admitting: Family Medicine

## 2021-03-05 MED ORDER — PANTOPRAZOLE SODIUM 40 MG PO TBEC: 40.0000 mg | DELAYED_RELEASE_TABLET | Freq: Every day | ORAL | 0 refills | Status: DC

## 2021-03-05 NOTE — Telephone Encounter (Signed)
Updated pt's pharmacy list.  E-scribed refill.

## 2021-04-23 ENCOUNTER — Other Ambulatory Visit: Payer: Self-pay | Admitting: Family Medicine

## 2021-04-27 ENCOUNTER — Other Ambulatory Visit: Payer: Self-pay

## 2021-04-27 ENCOUNTER — Ambulatory Visit: Payer: 59 | Admitting: Family Medicine

## 2021-04-27 ENCOUNTER — Encounter: Payer: Self-pay | Admitting: Family Medicine

## 2021-04-27 VITALS — BP 150/90 | HR 83 | Temp 97.6°F | Ht 67.0 in | Wt 257.2 lb

## 2021-04-27 DIAGNOSIS — K219 Gastro-esophageal reflux disease without esophagitis: Secondary | ICD-10-CM | POA: Diagnosis not present

## 2021-04-27 DIAGNOSIS — I10 Essential (primary) hypertension: Secondary | ICD-10-CM

## 2021-04-27 DIAGNOSIS — R059 Cough, unspecified: Secondary | ICD-10-CM | POA: Insufficient documentation

## 2021-04-27 DIAGNOSIS — R051 Acute cough: Secondary | ICD-10-CM | POA: Diagnosis not present

## 2021-04-27 MED ORDER — PANTOPRAZOLE SODIUM 40 MG PO TBEC: 40.0000 mg | DELAYED_RELEASE_TABLET | Freq: Every day | ORAL | 3 refills | Status: DC

## 2021-04-27 MED ORDER — BENZONATATE 100 MG PO CAPS: 100.0000 mg | ORAL_CAPSULE | Freq: Three times a day (TID) | ORAL | 0 refills | Status: DC | PRN

## 2021-04-27 MED ORDER — AMLODIPINE BESY-BENAZEPRIL HCL 5-20 MG PO CAPS: 1.0000 | ORAL_CAPSULE | Freq: Every day | ORAL | 6 refills | Status: DC

## 2021-04-27 MED ORDER — BENZONATATE 100 MG PO CAPS: 100.0000 mg | ORAL_CAPSULE | Freq: Two times a day (BID) | ORAL | 0 refills | Status: DC | PRN

## 2021-04-27 NOTE — Patient Instructions (Addendum)
Good to see you today BP remaining too high - increase BP medicine to amlodipine 5mg /benazepril 20mg  daily. Keep [place] updated with your blood pressure readings. Continue current medicines. We should consider endoscopy with GI given duration of reflux symptoms. Let me know if interested.  Return in 3 months for follow up visit.

## 2021-04-27 NOTE — Assessment & Plan Note (Signed)
BP remaining elevated despite taking meds daily and stopping caffeine. Will increase lotrel to 5/20mg  dose. Update with effect. RTC 3 mo HTN f/u visit.

## 2021-04-27 NOTE — Assessment & Plan Note (Signed)
Chronic, manages with daily pantoprazole 40mg  with PRN pepcid for breakthrough symptoms. No red flags. Given longevity of condition, discussed GI referral for EGD to r/o other causes of ongoing GERD. He will consider.

## 2021-04-27 NOTE — Progress Notes (Signed)
Patient ID: Perry Bender, male    DOB: [birth year], 35 y.o.   MRN: [id number]  This visit was conducted in person.  BP (!) 150/90   Pulse 83   Temp 97.6 F (36.4 C) (Temporal)   Ht 5\' 7"  (1.702 m)   Wt 257 lb 3 oz (116.7 kg)   SpO2 97%   BMI 40.28 kg/m    CC: HTN f/u, cough Subjective:   HPI: Perry Bender is a 35 y.o. male presenting on 04/27/2021 for Hypertension (Here for 3 mo f/u.  States BP has been running in 140-150s/90-100s.) and Cough (C/o prod cough on and off for about 1 mo. )   GERD - on pantoprazole since 2019. Previously on nexium. If he doesn't take pepcid at night can wake up with reflux in throat. Has not had EGD. No dysphagia or early satiety.   Intermittent cough over the past month. For last few days also producing green mucous. Seemed improved for a few weeks, now recently symptoms returning. No fevers/chills, ear or tooth pain, abd pain, nausea, ST.  Some allergic rhinitis history.  No h/o asthma.  Hasn't tried anything for this yet. Daughter sick as well.  H/o mild COVID 02/2021, symptoms fully recovered.   Reassuring echocardiogram as well as cardiology evaluation with calcium score of zero earlier this year. Quit smoking 07/2020. Has stopped caffeine.   Echocardiogram 12/2020 - WNL, EF 65-70%, normal wall motion, mild LVH, normal RV, poorly visualized AV, mild aortic root dilatation 32mm   HTN - Compliant with current antihypertensive regimen of amlodipine/benazepril 5/10 mg daily. Does check blood pressures at home: 140-150. No low blood pressure readings or symptoms of dizziness/syncope. Denies HA, vision changes, CP/tightness, SOB, leg swelling.      Relevant past medical, surgical, family and social history reviewed and updated as indicated. Interim medical history since our last visit reviewed. Allergies and medications reviewed and updated. Outpatient Medications Prior to Visit  Medication Sig Dispense Refill   clindamycin (CLEOCIN T) 1 %  external solution Apply 1 application topically 2 (two) times daily. Appy on the skin twice daily     famotidine (PEPCID) 20 MG tablet Take 1 tablet (20 mg total) by mouth at bedtime.     amLODipine-benazepril (LOTREL) 5-10 MG capsule TAKE 1 CAPSULE BY MOUTH ONCE DAILY TO REPLACE AMLODIPINE TEN MG 30 capsule 0   fluticasone (FLONASE) 50 MCG/ACT nasal spray USE 2 SPRAYS INTO BOTH NOSTRILS DAILY--NEEDS ANNUAL PHYSICAL APPT FOR FURTHER REFILLS 16 mL 2   pantoprazole (PROTONIX) 40 MG tablet Take 1 tablet (40 mg total) by mouth daily. 90 tablet 0   No facility-administered medications prior to visit.     Per HPI unless specifically indicated in ROS section below Review of Systems  Objective:  BP (!) 150/90   Pulse 83   Temp 97.6 F (36.4 C) (Temporal)   Ht 5\' 7"  (1.702 m)   Wt 257 lb 3 oz (116.7 kg)   SpO2 97%   BMI 40.28 kg/m   Wt Readings from Last 3 Encounters:  04/27/21 257 lb 3 oz (116.7 kg)  01/23/21 260 lb 6 oz (118.1 kg)  12/29/20 259 lb (117.5 kg)      Physical Exam Vitals and nursing note reviewed.  Constitutional:      Appearance: Normal appearance. He is obese. He is not ill-appearing.  HENT:     Head: Normocephalic and atraumatic.     Right Ear: Hearing, tympanic membrane, ear canal and external  ear normal. There is no impacted cerumen.     Left Ear: Hearing, tympanic membrane, ear canal and external ear normal. There is no impacted cerumen.     Nose: No mucosal edema.     Right Turbinates: Not enlarged or swollen.     Left Turbinates: Not enlarged or swollen.     Right Sinus: No maxillary sinus tenderness or frontal sinus tenderness.     Left Sinus: No maxillary sinus tenderness or frontal sinus tenderness.     Mouth/Throat:     Mouth: Mucous membranes are moist.     Pharynx: Oropharynx is clear. No oropharyngeal exudate or posterior oropharyngeal erythema.  Eyes:     Extraocular Movements: Extraocular movements intact.     Conjunctiva/sclera: Conjunctivae  normal.     Pupils: Pupils are equal, round, and reactive to light.  Cardiovascular:     Rate and Rhythm: Normal rate and regular rhythm.     Pulses: Normal pulses.     Heart sounds: Normal heart sounds. No murmur heard. Pulmonary:     Effort: Pulmonary effort is normal. No respiratory distress.     Breath sounds: Normal breath sounds. No wheezing, rhonchi or rales.  Musculoskeletal:     Cervical back: Normal range of motion and neck supple. No rigidity.     Right lower leg: No edema.     Left lower leg: No edema.  Lymphadenopathy:     Cervical: No cervical adenopathy.  Skin:    General: Skin is warm and dry.     Findings: No rash.  Neurological:     Mental Status: He is alert.  Psychiatric:        Mood and Affect: Mood normal.        Behavior: Behavior normal.      Assessment & Plan:  This visit occurred during the SARS-CoV-2 public health emergency.  Safety protocols were in place, including screening questions prior to the visit, additional usage of staff PPE, and extensive cleaning of exam room while observing appropriate contact time as indicated for disinfecting solutions.   Problem List Items Addressed This Visit     HTN (hypertension) - Primary    BP remaining elevated despite taking meds daily and stopping caffeine. Will increase lotrel to 5/20mg  dose. Update with effect. RTC 3 mo HTN f/u visit.       Relevant Medications   amLODipine-benazepril (LOTREL) 5-20 MG capsule   GERD (gastroesophageal reflux disease)    Chronic, manages with daily pantoprazole 40mg  with PRN pepcid for breakthrough symptoms. No red flags. Given longevity of condition, discussed GI referral for EGD to r/o other causes of ongoing GERD. He will consider.       Relevant Medications   pantoprazole (PROTONIX) 40 MG tablet   Cough    Anticipate viral bronchitis vs allergic. Lungs clear.  rec tessalon perls, discussed OTC remedies to use corcedin HBP, avoid decongestants.  Rx tessalon  perls. Update if worsening or ongoing symptoms to consider abx course.         Meds ordered this encounter  Medications   amLODipine-benazepril (LOTREL) 5-20 MG capsule    Sig: Take 1 capsule by mouth daily.    Dispense:  30 capsule    Refill:  6    Note new dose   pantoprazole (PROTONIX) 40 MG tablet    Sig: Take 1 tablet (40 mg total) by mouth daily.    Dispense:  90 tablet    Refill:  3   DISCONTD: benzonatate (TESSALON)  100 MG capsule    Sig: Take 1 capsule (100 mg total) by mouth 2 (two) times daily as needed for cough. Swallow, don't chew    Dispense:  30 capsule    Refill:  0   benzonatate (TESSALON) 100 MG capsule    Sig: Take 1 capsule (100 mg total) by mouth 3 (three) times daily as needed for cough. Swallow, don't chew    Dispense:  30 capsule    Refill:  0   No orders of the defined types were placed in this encounter.    Patient Instructions  Good to see you today BP remaining too high - increase BP medicine to amlodipine 5mg /benazepril 20mg  daily. Keep [place] updated with your blood pressure readings. Continue current medicines. We should consider endoscopy with GI given duration of reflux symptoms. Let me know if interested.  Return in 3 months for follow up visit.   Follow up plan: Return in about 3 months (around 07/28/2021) for follow up visit.  Ria Bush, MD

## 2021-04-27 NOTE — Assessment & Plan Note (Signed)
Anticipate viral bronchitis vs allergic. Lungs clear.  rec tessalon perls, discussed OTC remedies to use corcedin HBP, avoid decongestants.  Rx tessalon perls. Update if worsening or ongoing symptoms to consider abx course.

## 2021-07-13 ENCOUNTER — Telehealth: Payer: Self-pay | Admitting: Family Medicine

## 2021-07-13 ENCOUNTER — Encounter: Payer: Self-pay | Admitting: Family Medicine

## 2021-07-29 ENCOUNTER — Ambulatory Visit: Payer: 59 | Admitting: Family Medicine

## 2021-08-25 IMAGING — CT CT CARDIAC CORONARY ARTERY CALCIUM SCORE
3 series · 14 of 20 positions shown, 15 images · non-contrast
Comparison: None.
COMPARISON: None.

Addendum:
EXAM:
OVER-READ INTERPRETATION  CT CHEST

The following report is an over-read performed by radiologist Dr.
Hussain De Simart Cele [REDACTED] on 12/29/2020. This
over-read does not include interpretation of cardiac or coronary
anatomy or pathology. The coronary calcium score interpretation by
the cardiologist is attached.
CLINICAL DATA: Risk stratification
Coronary Calcium Score
TECHNIQUE: The patient was scanned on a Siemens Somatom 64 slice scanner. Axial
non-contrast 3 mm slices were carried out through the heart. The
data set was analyzed on a dedicated work station and scored using
the Agatson method.

[Series 2: casc 3.0 bv41 2 bestdiast 76 % · axial · 0.49mm/px · z∈[+1310,+1382]mm · 4 of 42 slices shown, 5 images]
[im 9/42  vessel]
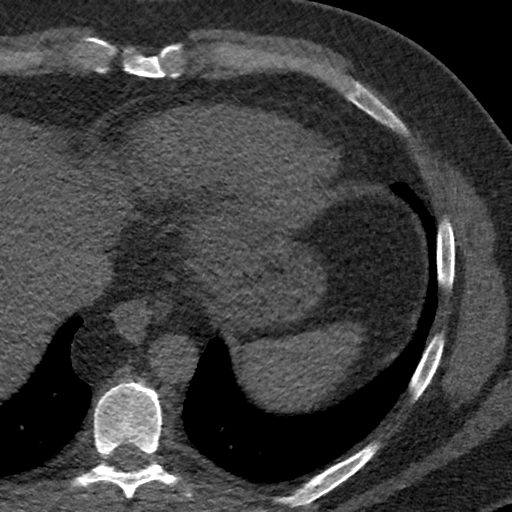
[im 9/42  lung]
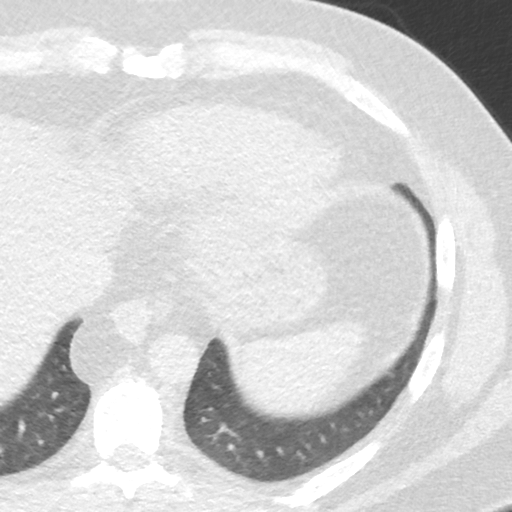
[im 17/42  vessel]
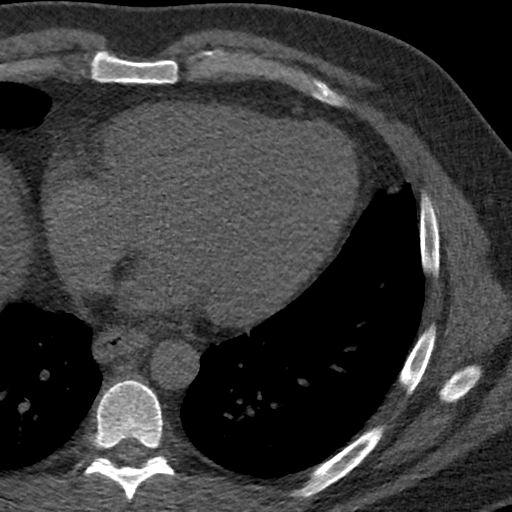
[im 25/42  vessel]
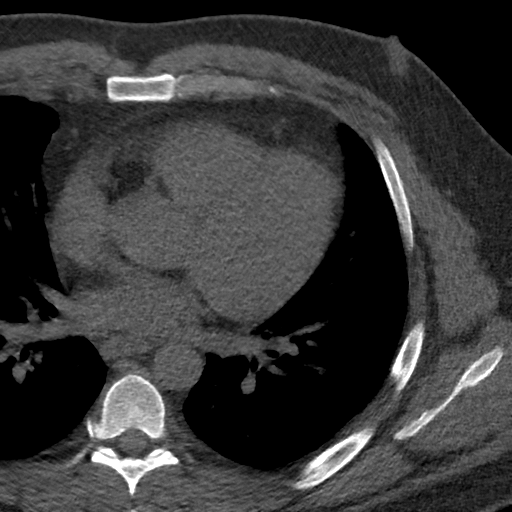
[im 33/42  vessel]
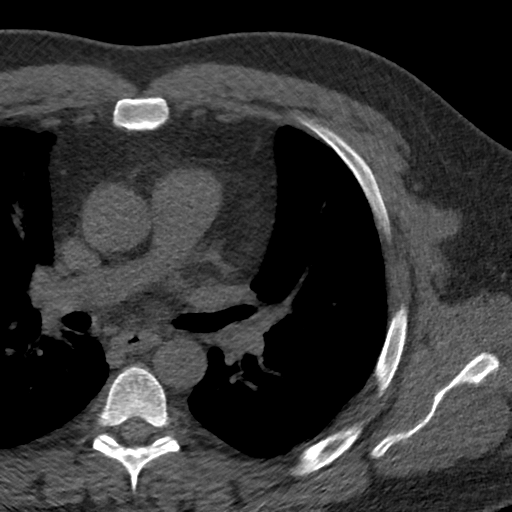

[Series 3: lung 76 % · axial · 0.68mm/px · z∈[+1304,+1388]mm · 5 of 42 slices shown]
[im 7/42  lung]
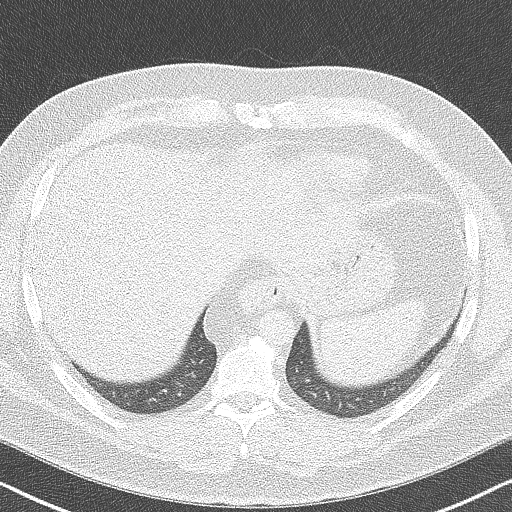
[im 14/42  lung]
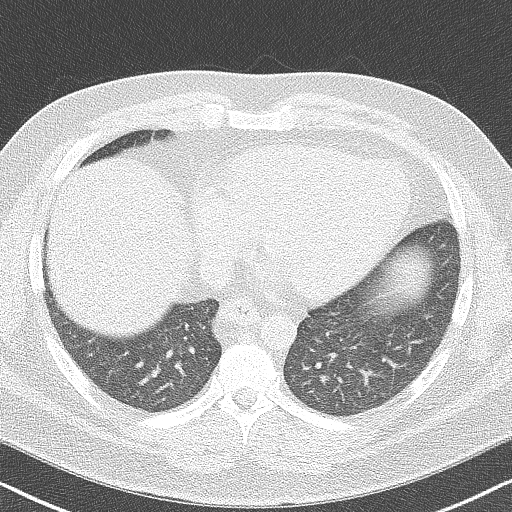
[im 21/42  lung]
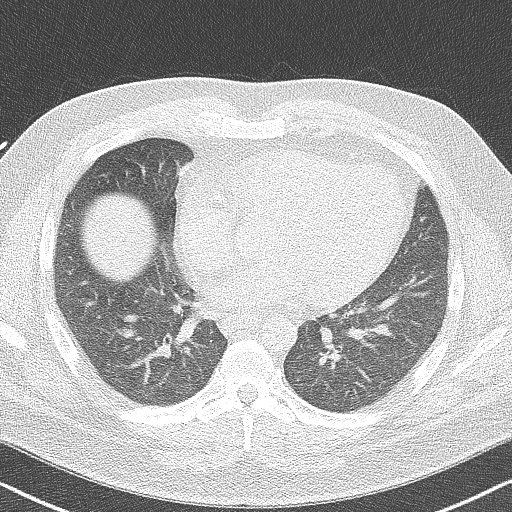
[im 28/42  lung]
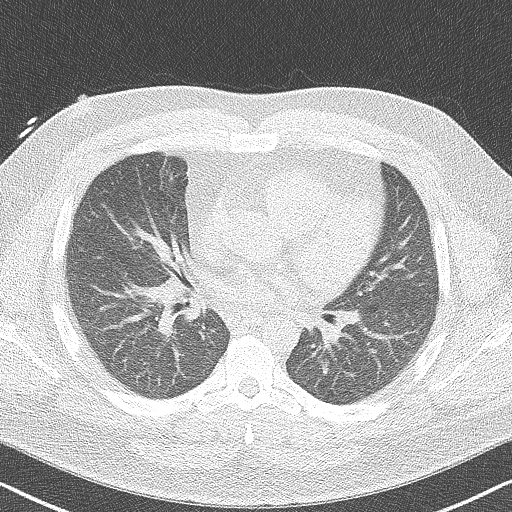
[im 35/42  lung]
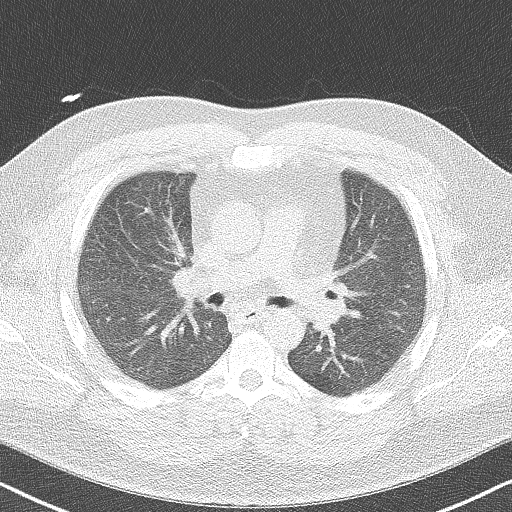

[Series 4: lung st 76 % · axial · 0.68mm/px · z∈[+1304,+1388]mm · 5 of 42 slices shown]
[im 7/42  lung]
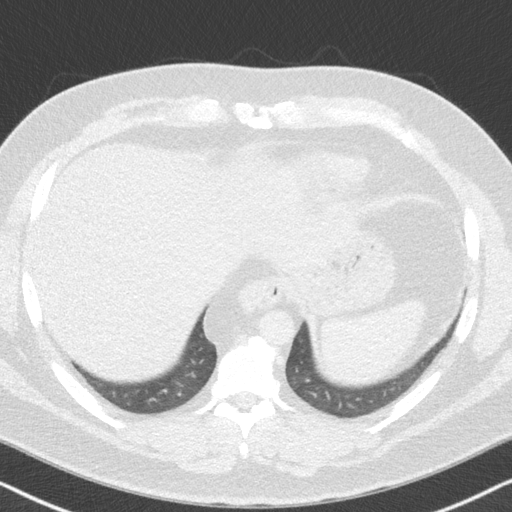
[im 14/42  lung]
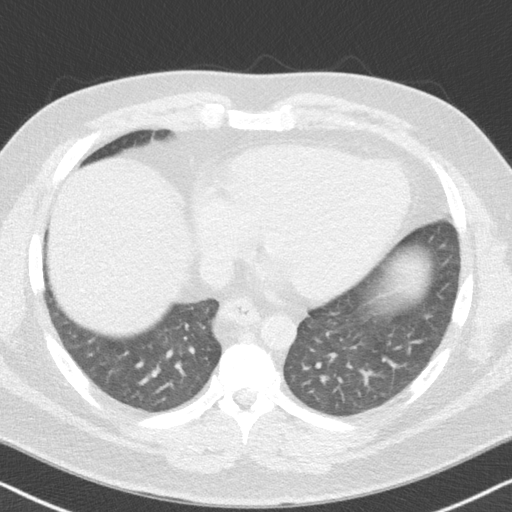
[im 21/42  lung]
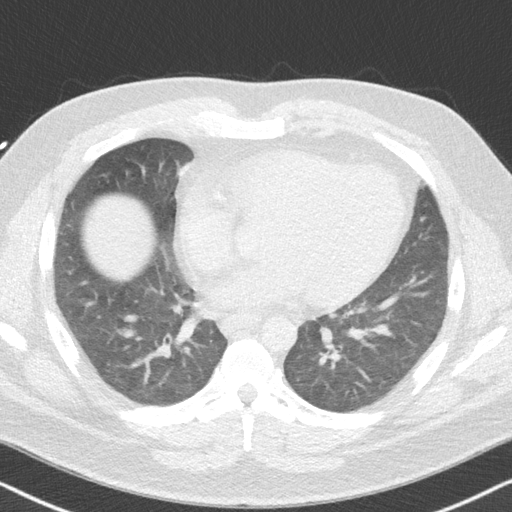
[im 28/42  lung]
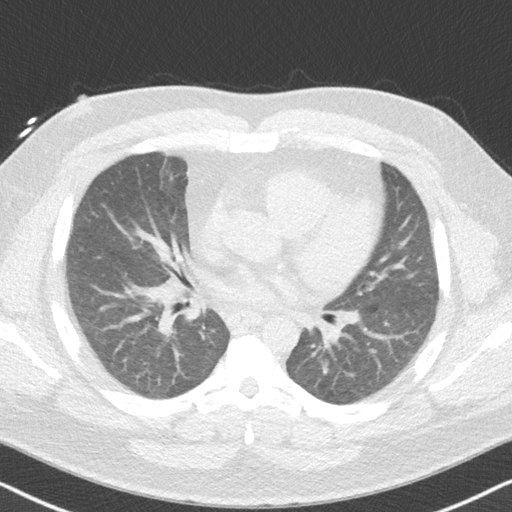
[im 35/42  lung]
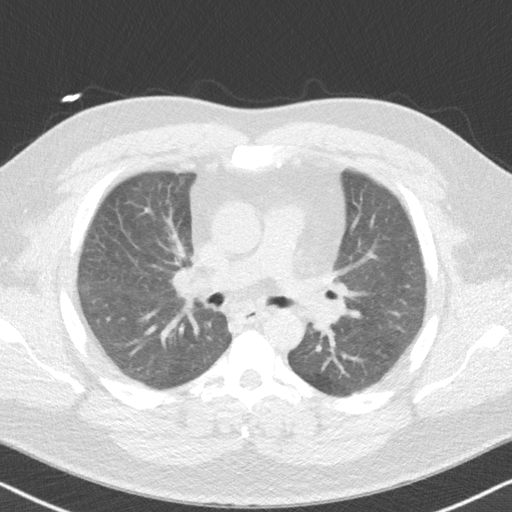

[14 of 20 positions shown; findings below may reference images not displayed]

FINDINGS: Within the visualized portions of the thorax there are no suspicious
appearing pulmonary nodules or masses, there is no acute
consolidative airspace disease, no pleural effusions, no
pneumothorax and no lymphadenopathy. Visualized portions of the
upper abdomen demonstrates mild diffuse low attenuation throughout
the visualized hepatic parenchyma. There are no aggressive appearing
lytic or blastic lesions noted in the visualized portions of the
skeleton.
IMPRESSION: Hepatic steatosis.
FINDINGS: Non-cardiac: See separate report from [REDACTED].

Ascending aorta: Normal diameter 3.2 cm

Pericardium: Normal

Coronary arteries: No calcium noted
IMPRESSION: Coronary calcium score of 0.

Zeinab Tiger

*** End of Addendum ***
EXAM:
OVER-READ INTERPRETATION  CT CHEST

The following report is an over-read performed by radiologist Dr.
Hussain De Simart Cele [REDACTED] on 12/29/2020. This
over-read does not include interpretation of cardiac or coronary
anatomy or pathology. The coronary calcium score interpretation by
the cardiologist is attached.
FINDINGS: Within the visualized portions of the thorax there are no suspicious
appearing pulmonary nodules or masses, there is no acute
consolidative airspace disease, no pleural effusions, no
pneumothorax and no lymphadenopathy. Visualized portions of the
upper abdomen demonstrates mild diffuse low attenuation throughout
the visualized hepatic parenchyma. There are no aggressive appearing
lytic or blastic lesions noted in the visualized portions of the
skeleton.
IMPRESSION: Hepatic steatosis.

## 2021-11-18 ENCOUNTER — Other Ambulatory Visit: Payer: Self-pay | Admitting: Family Medicine

## 2022-01-01 NOTE — Telephone Encounter (Signed)
error 

## 2022-01-15 ENCOUNTER — Other Ambulatory Visit: Payer: Self-pay | Admitting: Family Medicine

## 2022-01-15 NOTE — Telephone Encounter (Signed)
Noted  

## 2022-01-15 NOTE — Telephone Encounter (Signed)
E-scribed refill.  Pt overdue for HTN f/u (due 07/2021).  Needs to schedule f/u for additional refills.

## 2022-01-15 NOTE — Telephone Encounter (Signed)
Called patient and LVM to call back to schedule follow up.

## 2022-02-15 ENCOUNTER — Other Ambulatory Visit: Payer: Self-pay | Admitting: Family Medicine

## 2022-02-15 NOTE — Telephone Encounter (Signed)
Please call patient and schedule follow-up appointment. Patient was advised last refill that he needed to be seen for further refills. Send back to CMA after appointment is schedule.

## 2022-02-16 NOTE — Telephone Encounter (Signed)
E-scribed refill 

## 2022-02-16 NOTE — Telephone Encounter (Signed)
Patient has been scheduled

## 2022-03-02 ENCOUNTER — Encounter: Payer: Self-pay | Admitting: Family Medicine

## 2022-03-02 ENCOUNTER — Ambulatory Visit: Payer: 59 | Admitting: Family Medicine

## 2022-03-02 VITALS — BP 132/74 | HR 100 | Temp 97.6°F | Ht 67.0 in | Wt 246.5 lb

## 2022-03-02 DIAGNOSIS — E785 Hyperlipidemia, unspecified: Secondary | ICD-10-CM | POA: Diagnosis not present

## 2022-03-02 DIAGNOSIS — I10 Essential (primary) hypertension: Secondary | ICD-10-CM | POA: Diagnosis not present

## 2022-03-02 DIAGNOSIS — K219 Gastro-esophageal reflux disease without esophagitis: Secondary | ICD-10-CM | POA: Diagnosis not present

## 2022-03-02 LAB — LIPID PANEL
Cholesterol: 182 mg/dL (ref 0–200)
HDL: 34.2 mg/dL — ABNORMAL LOW (ref >39.00)
LDL Cholesterol: 113 mg/dL — ABNORMAL HIGH (ref 0–99)
NonHDL: 148.01
Total CHOL/HDL Ratio: 5
Triglycerides: 175 mg/dL — ABNORMAL HIGH (ref 0.0–149.0)
VLDL: 35 mg/dL (ref 0.0–40.0)

## 2022-03-02 LAB — MICROALBUMIN / CREATININE URINE RATIO
Creatinine,U: 58.1 mg/dL
Microalb Creat Ratio: 1.2 mg/g (ref 0.0–30.0)
Microalb, Ur: <0.7 mg/dL (ref 0.0–1.9)

## 2022-03-02 LAB — COMPREHENSIVE METABOLIC PANEL
ALT: 38 U/L (ref 0–53)
AST: 21 U/L (ref 0–37)
Albumin: 4.3 g/dL (ref 3.5–5.2)
Alkaline Phosphatase: 68 U/L (ref 39–117)
BUN: 20 mg/dL (ref 6–23)
CO2: 27 mEq/L (ref 19–32)
Calcium: 9.7 mg/dL (ref 8.4–10.5)
Chloride: 101 mEq/L (ref 96–112)
Creatinine, Ser: 0.89 mg/dL (ref 0.40–1.50)
GFR: 110.74 mL/min (ref >60.00)
Glucose, Bld: 88 mg/dL (ref 70–99)
Potassium: 4.3 mEq/L (ref 3.5–5.1)
Sodium: 137 mEq/L (ref 135–145)
Total Bilirubin: 0.7 mg/dL (ref 0.2–1.2)
Total Protein: 7.1 g/dL (ref 6.0–8.3)

## 2022-03-02 MED ORDER — PANTOPRAZOLE SODIUM 40 MG PO TBEC: 40.0000 mg | DELAYED_RELEASE_TABLET | Freq: Every day | ORAL | 3 refills | Status: DC

## 2022-03-02 MED ORDER — AMLODIPINE BESY-BENAZEPRIL HCL 5-20 MG PO CAPS: 1.0000 | ORAL_CAPSULE | Freq: Every day | ORAL | 3 refills | Status: DC

## 2022-03-02 NOTE — Patient Instructions (Addendum)
Labs/urine today Good to see you today Blood pressure is doing well today - continue Lotrel medication Continue pantoprazole '40mg'$  daily.  Return as needed or in 1 year for next physical.

## 2022-03-02 NOTE — Assessment & Plan Note (Signed)
Discussed healthy diet and lifestyle choices to affect sustainable weight loss.  Could consider trial weekly Wegovy in future.

## 2022-03-02 NOTE — Progress Notes (Signed)
Patient ID: Perry Bender, male    DOB: Nov 11, 1985, 36 y.o.   MRN: [id number]  This visit was conducted in person.  BP 132/74   Pulse 100   Temp 97.6 F (36.4 C) (Temporal)   Ht '5\' 7"'$  (1.702 m)   Wt 246 lb 8 oz (111.8 kg)   SpO2 95%   BMI 38.61 kg/m    CC: HTN f/u Subjective:   HPI: Perry Bender is a 36 y.o. male presenting on 03/02/2022 for Hypertension (Here for f/u. )   HTN - Compliant with current antihypertensive regimen of lotrel 5/'20mg'$  daily. Does check blood pressures at home: comparable to today's readings.  No low blood pressure readings or symptoms of dizziness/syncope.  Denies HA, vision changes, CP/tightness, SOB, leg swelling.    Reassuring cardiac evaluation including echo and calcium score of zero 2022.  Echocardiogram 12/2020 - WNL, EF 65-70%, normal wall motion, mild LVH, normal RV, poorly visualized AV, mild aortic root dilatation 60m   GERD - on pantoprazole since 2019. Breakthrough symptoms with 1 missed dose. Also takes pepcid at night time PRN.   Obesity - regularly going to gym 3+d/wk for the past 6 months. Difficulty losing weight.  Diet - protein shake in the mornings, poor diet during the day but eats healthier when at home.   Quit smoking 2022.      Relevant past medical, surgical, family and social history reviewed and updated as indicated. Interim medical history since our last visit reviewed. Allergies and medications reviewed and updated. Outpatient Medications Prior to Visit  Medication Sig Dispense Refill   benzonatate (TESSALON) 100 MG capsule Take 1 capsule (100 mg total) by mouth 3 (three) times daily as needed for cough. Swallow, don't chew 30 capsule 0   clindamycin (CLEOCIN T) 1 % external solution Apply 1 application topically 2 (two) times daily. Appy on the skin twice daily     famotidine (PEPCID) 20 MG tablet Take 1 tablet (20 mg total) by mouth at bedtime.     amLODipine-benazepril (LOTREL) 5-20 MG capsule TAKE 1 CAPSULE BY  MOUTH ONCE DAILY (NEEDOFFICE VISIT FOR REFILLS) 30 capsule 0   pantoprazole (PROTONIX) 40 MG tablet Take 1 tablet (40 mg total) by mouth daily. 90 tablet 3   No facility-administered medications prior to visit.     Per HPI unless specifically indicated in ROS section below Review of Systems  Objective:  BP 132/74   Pulse 100   Temp 97.6 F (36.4 C) (Temporal)   Ht '5\' 7"'$  (1.702 m)   Wt 246 lb 8 oz (111.8 kg)   SpO2 95%   BMI 38.61 kg/m   Wt Readings from Last 3 Encounters:  03/02/22 246 lb 8 oz (111.8 kg)  04/27/21 257 lb 3 oz (116.7 kg)  01/23/21 260 lb 6 oz (118.1 kg)      Physical Exam Vitals and nursing note reviewed.  Constitutional:      Appearance: Normal appearance. He is not ill-appearing.  HENT:     Mouth/Throat:     Mouth: Mucous membranes are moist.     Pharynx: Oropharynx is clear. No oropharyngeal exudate or posterior oropharyngeal erythema.  Eyes:     General:        Right eye: No discharge.        Left eye: No discharge.     Extraocular Movements: Extraocular movements intact.     Conjunctiva/sclera: Conjunctivae normal.     Pupils: Pupils are equal, round, and reactive  to light.  Neck:     Thyroid: No thyroid mass or thyromegaly.  Cardiovascular:     Rate and Rhythm: Normal rate and regular rhythm.     Pulses: Normal pulses.     Heart sounds: Normal heart sounds. No murmur heard. Pulmonary:     Effort: Pulmonary effort is normal. No respiratory distress.     Breath sounds: Normal breath sounds. No wheezing, rhonchi or rales.  Musculoskeletal:     Right lower leg: No edema.     Left lower leg: No edema.  Skin:    General: Skin is warm and dry.     Findings: No rash.  Neurological:     Mental Status: He is alert.  Psychiatric:        Mood and Affect: Mood normal.        Behavior: Behavior normal.       Results for orders placed or performed during the hospital encounter of 12/24/20  ECHOCARDIOGRAM COMPLETE  Result Value Ref Range   Ao pk  vel 0.96 m/s   AV Area VTI 4.54 cm2   AR max vel 4.04 cm2   AV Mean grad 2.0 mmHg   AV Peak grad 3.7 mmHg   S' Lateral 2.33 cm   AV Area mean vel 4.01 cm2   Area-P 1/2 3.45 cm2    Assessment & Plan:   Problem List Items Addressed This Visit     HTN (hypertension) - Primary    Chronic, stable on lotrel - continue this.       Relevant Medications   amLODipine-benazepril (LOTREL) 5-20 MG capsule   Other Relevant Orders   Comprehensive metabolic panel   Microalbumin / creatinine urine ratio   Dyslipidemia    Update FLP today.  The ASCVD Risk score (Arnett DK, et al., 2019) failed to calculate for the following reasons:   The 2019 ASCVD risk score is only valid for ages 63 to 39       Relevant Orders   Lipid panel   Comprehensive metabolic panel   Severe obesity (BMI 35.0-39.9) with comorbidity ([place])    Discussed healthy diet and lifestyle choices to affect sustainable weight loss.  Could consider trial weekly Wegovy in future.       GERD (gastroesophageal reflux disease)    Chronic, stable on daily pantoprazole '40mg'$  daily with pepcid at night PRN.  nexium was more effective but insurance stopped covering this. On PPI since 2019. Discussed GI referral next year if ongoing need.      Relevant Medications   pantoprazole (PROTONIX) 40 MG tablet     Meds ordered this encounter  Medications   amLODipine-benazepril (LOTREL) 5-20 MG capsule    Sig: Take 1 capsule by mouth daily.    Dispense:  90 capsule    Refill:  3   pantoprazole (PROTONIX) 40 MG tablet    Sig: Take 1 tablet (40 mg total) by mouth daily.    Dispense:  90 tablet    Refill:  3   Orders Placed This Encounter  Procedures   Lipid panel   Comprehensive metabolic panel   Microalbumin / creatinine urine ratio     Patient Instructions  Labs/urine today Good to see you today Blood pressure is doing well today - continue Lotrel medication Continue pantoprazole '40mg'$  daily.  Return as needed or in 1  year for next physical.   Follow up plan: Return in about 1 year (around 03/03/2023) for follow up visit.  Ria Bush, MD

## 2022-03-02 NOTE — Assessment & Plan Note (Addendum)
Update FLP today.  The ASCVD Risk score (Arnett DK, et al., 2019) failed to calculate for the following reasons:   The 2019 ASCVD risk score is only valid for ages 40 to 79  

## 2022-03-02 NOTE — Assessment & Plan Note (Signed)
Chronic, stable on lotrel - continue this.

## 2022-03-02 NOTE — Assessment & Plan Note (Signed)
Chronic, stable on daily pantoprazole '40mg'$  daily with pepcid at night PRN.  nexium was more effective but insurance stopped covering this. On PPI since 2019. Discussed GI referral next year if ongoing need.

## 2022-03-04 ENCOUNTER — Encounter: Payer: Self-pay | Admitting: Family Medicine

## 2022-03-04 DIAGNOSIS — Z87891 Personal history of nicotine dependence: Secondary | ICD-10-CM | POA: Insufficient documentation

## 2023-01-31 ENCOUNTER — Emergency Department: Payer: 59

## 2023-01-31 ENCOUNTER — Other Ambulatory Visit: Payer: Self-pay

## 2023-01-31 ENCOUNTER — Emergency Department
Admission: EM | Admit: 2023-01-31 | Discharge: 2023-01-31 | Disposition: A | Discharge status: Home / Self Care | Payer: 59 | Source: Home / Self Care | Attending: Emergency Medicine | Admitting: Emergency Medicine

## 2023-01-31 DIAGNOSIS — N132 Hydronephrosis with renal and ureteral calculous obstruction: Secondary | ICD-10-CM | POA: Diagnosis not present

## 2023-01-31 DIAGNOSIS — R109 Unspecified abdominal pain: Secondary | ICD-10-CM | POA: Diagnosis present

## 2023-01-31 DIAGNOSIS — N200 Calculus of kidney: Secondary | ICD-10-CM

## 2023-01-31 LAB — CBC WITH DIFFERENTIAL/PLATELET
Abs Immature Granulocytes: 0.21 10*3/uL — ABNORMAL HIGH (ref 0.00–0.07)
Basophils Absolute: 0.1 10*3/uL (ref 0.0–0.1)
Basophils Relative: 1 %
Eosinophils Absolute: 0.3 10*3/uL (ref 0.0–0.5)
Eosinophils Relative: 3 %
HCT: 45.4 % (ref 39.0–52.0)
Hemoglobin: 15.3 g/dL (ref 13.0–17.0)
Immature Granulocytes: 2 %
Lymphocytes Relative: 16 %
Lymphs Abs: 2.1 10*3/uL (ref 0.7–4.0)
MCH: 31 pg (ref 26.0–34.0)
MCHC: 33.7 g/dL (ref 30.0–36.0)
MCV: 92.1 fL (ref 80.0–100.0)
Monocytes Absolute: 0.6 10*3/uL (ref 0.1–1.0)
Monocytes Relative: 4 %
Neutro Abs: 10.2 10*3/uL — ABNORMAL HIGH (ref 1.7–7.7)
Neutrophils Relative %: 74 %
Platelets: 358 10*3/uL (ref 150–400)
RBC: 4.93 MIL/uL (ref 4.22–5.81)
RDW: 12 % (ref 11.5–15.5)
WBC: 13.4 10*3/uL — ABNORMAL HIGH (ref 4.0–10.5)
nRBC: 0 % (ref 0.0–0.2)

## 2023-01-31 LAB — COMPREHENSIVE METABOLIC PANEL
ALT: 50 U/L — ABNORMAL HIGH (ref 0–44)
AST: 26 U/L (ref 15–41)
Albumin: 4.2 g/dL (ref 3.5–5.0)
Alkaline Phosphatase: 62 U/L (ref 38–126)
Anion gap: 10 (ref 5–15)
BUN: 15 mg/dL (ref 6–20)
CO2: 25 mmol/L (ref 22–32)
Calcium: 8.9 mg/dL (ref 8.9–10.3)
Chloride: 103 mmol/L (ref 98–111)
Creatinine, Ser: 0.9 mg/dL (ref 0.61–1.24)
GFR, Estimated: >60 mL/min (ref >60)
Glucose, Bld: 128 mg/dL — ABNORMAL HIGH (ref 70–99)
Potassium: 3.7 mmol/L (ref 3.5–5.1)
Sodium: 138 mmol/L (ref 135–145)
Total Bilirubin: 0.8 mg/dL (ref 0.3–1.2)
Total Protein: 7.6 g/dL (ref 6.5–8.1)

## 2023-01-31 LAB — URINALYSIS, ROUTINE W REFLEX MICROSCOPIC
Bilirubin Urine: NEGATIVE
Glucose, UA: NEGATIVE mg/dL
Hgb urine dipstick: NEGATIVE
Ketones, ur: NEGATIVE mg/dL
Leukocytes,Ua: NEGATIVE
Nitrite: NEGATIVE
Protein, ur: 100 mg/dL — AB
Specific Gravity, Urine: 1.021 (ref 1.005–1.030)
pH: 6 (ref 5.0–8.0)

## 2023-01-31 LAB — LIPASE, BLOOD: Lipase: 36 U/L (ref 11–51)

## 2023-01-31 MED ORDER — KETOROLAC TROMETHAMINE 30 MG/ML IJ SOLN
30.0000 mg | Freq: Once | INTRAMUSCULAR | Status: AC
  Administered 2023-01-31: 30 mg via INTRAMUSCULAR
  Filled 2023-01-31: qty 1

## 2023-01-31 NOTE — Discharge Instructions (Signed)
Take acetaminophen 650 mg and ibuprofen 400 mg every 6 hours for pain.  Take with food.  Thank you for choosing us for your health care today!  Please see your primary doctor this week for a follow up appointment.   If you have any new, worsening, or unexpected symptoms call your doctor right away or come back to the emergency department for reevaluation.  It was my pleasure to care for you today.   Silas S. Wong, MD  

## 2023-01-31 NOTE — ED Triage Notes (Signed)
Pt reports lower middle back pain x 2-3 days. Reports now exclusively R flank pain. Reports increased urinary frequency with decreased output since yesterday. No prior hx of kidney stones. Pt denies n/v/d. Pt alert and oriented following commands. Breathing unlabored speaking in full sentences. Symmetric chest rise and fall.

## 2023-01-31 NOTE — ED Provider Notes (Signed)
[facility] Provider Note    Event Date/Time   First MD Initiated Contact with Patient 01/31/23 2033     (approximate)   History   Flank Pain   HPI  Perry Bender is a 37 y.o. male   Past medical history of hypertension GERD who presents emergency department with right-sided flank pain.  Several days of midline pain migrated to the right flank, right lower abdomen and radiating to the groin.  Severe intermittent pain.  No history of kidney stones.  Feels like he had difficulty urinating had to strain to urinate.  Independent Historian contributed to assessment above: His wife is at bedside corroborate information given above    Physical Exam   Triage Vital Signs: ED Triage Vitals  Encounter Vitals Group     BP 01/31/23 1959 139/86     Systolic BP Percentile --      Diastolic BP Percentile --      Pulse Rate 01/31/23 1959 79     Resp 01/31/23 1959 20     Temp 01/31/23 1959 98.3 F (36.8 C)     Temp Source 01/31/23 1959 Oral     SpO2 01/31/23 1959 96 %     Weight 01/31/23 2000 245 lb (111.1 kg)     Height 01/31/23 2000 5\' 7"  (1.702 m)     Head Circumference --      Peak Flow --      Pain Score 01/31/23 2005 8     Pain Loc --      Pain Education --      Exclude from Growth Chart --     Most recent vital signs: Vitals:   01/31/23 1959  BP: 139/86  Pulse: 79  Resp: 20  Temp: 98.3 F (36.8 C)  SpO2: 96%    General: Awake, no distress.  CV:  Good peripheral perfusion.  Resp:  Normal effort.  Abd:  No distention.  Other:  Awake alert normal hemodynamics, afebrile.  Mild right-sided CVA tenderness without any tenderness to deep palpation in any of the abdominal quadrants.   ED Results / Procedures / Treatments   Labs (all labs ordered are listed, but only abnormal results are displayed) Labs Reviewed  CBC WITH DIFFERENTIAL/PLATELET - Abnormal; Notable for the following components:      Result Value   WBC 13.4 (*)    Neutro Abs  10.2 (*)    Abs Immature Granulocytes 0.21 (*)    All other components within normal limits  COMPREHENSIVE METABOLIC PANEL - Abnormal; Notable for the following components:   Glucose, Bld 128 (*)    ALT 50 (*)    All other components within normal limits  URINALYSIS, ROUTINE W REFLEX MICROSCOPIC - Abnormal; Notable for the following components:   Color, Urine YELLOW (*)    APPearance CLEAR (*)    Protein, ur 100 (*)    Bacteria, UA RARE (*)    All other components within normal limits  LIPASE, BLOOD     I ordered and reviewed the above labs they are notable for [place] blood cell count elevated 13.   RADIOLOGY I independently reviewed and interpreted CT scan of the abdomen pelvis see no obvious obstructive or inflammatory changes I also reviewed radiologist's formal read.   PROCEDURES:  Critical Care performed: No  Procedures   MEDICATIONS ORDERED IN ED: Medications  ketorolac (TORADOL) 30 MG/ML injection 30 mg (30 mg Intramuscular Given 01/31/23 2102)     IMPRESSION / MDM /  ASSESSMENT AND PLAN / ED COURSE  I reviewed the triage vital signs and the nursing notes.                                Patient's presentation is most consistent with acute presentation with potential threat to life or bodily function.  Differential diagnosis includes, but is not limited to, renal colic, pyelonephritis, kidney stone, obstructive uropathy   The patient is on the cardiac monitor to evaluate for evidence of arrhythmia and/or significant heart rate changes.  MDM: Patient with flank pain intermittent severe rating to groin suggestive of renal colic, CT imaging shows no stone but evidence of recently passed right ureteral calculus with mild right hydronephrosis and periureteral stranding.  Pain well-controlled at this time, no evidence of urinary tract infection, nontoxic well-appearing patient.  I considered other emergency conditions like appendicitis but doubt given no right lower  quadrant tenderness to palpation and no evidence of appendicitis on CT imaging.    I gave him information about dietary changes to prevent further kidney stone formation, urology contact information to establish care, will follow-up with PMD.        FINAL CLINICAL IMPRESSION(S) / ED DIAGNOSES   Final diagnoses:  Right flank pain  Kidney stone     Rx / DC Orders   ED Discharge Orders     None        Note:  This document was prepared using Dragon voice recognition software and may include unintentional dictation errors.    Pilar Jarvis, MD 01/31/23 2245

## 2023-02-01 ENCOUNTER — Encounter: Payer: Self-pay | Admitting: Family Medicine

## 2023-02-01 ENCOUNTER — Telehealth: Payer: Self-pay

## 2023-02-01 NOTE — Transitions of Care (Post Inpatient/ED Visit) (Signed)
   02/01/2023  Name: Perry Bender MRN: [id number] DOB: [birth year]  Today's TOC FU Call Status: Today's TOC FU Call Status:: Unsuccessful Call (1st Attempt) Unsuccessful Call (1st Attempt) Date: 02/01/23  Attempted to reach the patient regarding the most recent Inpatient/ED visit.  Follow Up Plan: Additional outreach attempts will be made to reach the patient to complete the Transitions of Care (Post Inpatient/ED visit) call.   Signature   Woodfin Ganja LPN [facility] Nurse Health Advisor Direct Dial [phone]

## 2023-02-03 NOTE — Transitions of Care (Post Inpatient/ED Visit) (Signed)
   02/03/2023  Name: Perry Bender MRN: [id number] DOB: 1986/06/14  Today's TOC FU Call Status: Today's TOC FU Call Status:: Unsuccessful Call (2nd Attempt) Unsuccessful Call (1st Attempt) Date: 02/01/23 Unsuccessful Call (2nd Attempt) Date: 02/03/23  Attempted to reach the patient regarding the most recent Inpatient/ED visit.  Follow Up Plan: Additional outreach attempts will be made to reach the patient to complete the Transitions of Care (Post Inpatient/ED visit) call.   Signature   Woodfin Ganja LPN [facility] Nurse Health Advisor Direct Dial [phone]

## 2023-02-08 NOTE — Transitions of Care (Post Inpatient/ED Visit) (Signed)
   02/08/2023  Name: Perry Bender MRN: [id number] DOB: 06-Jun-1986  Today's TOC FU Call Status: Today's TOC FU Call Status:: Successful TOC FU Call Completed Unsuccessful Call (1st Attempt) Date: 02/01/23 Unsuccessful Call (2nd Attempt) Date: 02/03/23 Unsuccessful Call (3rd Attempt) Date: 02/08/23 [facility] FU Call Complete Date: 02/08/23  Attempted to reach the patient regarding the most recent Inpatient/ED visit.  Follow Up Plan: No further outreach attempts will be made at this time. We have been unable to contact the patient.  Signature   Woodfin Ganja LPN [facility] Nurse Health Advisor Direct Dial [phone]

## 2023-03-04 ENCOUNTER — Other Ambulatory Visit: Payer: Self-pay | Admitting: Family Medicine

## 2023-03-04 DIAGNOSIS — I10 Essential (primary) hypertension: Secondary | ICD-10-CM

## 2023-03-04 DIAGNOSIS — K219 Gastro-esophageal reflux disease without esophagitis: Secondary | ICD-10-CM

## 2023-03-04 NOTE — Telephone Encounter (Signed)
Please call patient and set up cpe let us know when done and we will send for approval for refill

## 2023-03-04 NOTE — Telephone Encounter (Signed)
Pt already has cpe scheduled for 06/07/23 .

## 2023-05-20 ENCOUNTER — Other Ambulatory Visit: Payer: Self-pay | Admitting: Family Medicine

## 2023-05-20 DIAGNOSIS — I10 Essential (primary) hypertension: Secondary | ICD-10-CM

## 2023-05-20 DIAGNOSIS — K219 Gastro-esophageal reflux disease without esophagitis: Secondary | ICD-10-CM

## 2023-05-23 NOTE — Telephone Encounter (Signed)
ERx 

## 2023-05-26 ENCOUNTER — Other Ambulatory Visit: Payer: Self-pay | Admitting: Family Medicine

## 2023-05-26 DIAGNOSIS — Z1159 Encounter for screening for other viral diseases: Secondary | ICD-10-CM

## 2023-05-26 DIAGNOSIS — R7401 Elevation of levels of liver transaminase levels: Secondary | ICD-10-CM

## 2023-05-26 DIAGNOSIS — E785 Hyperlipidemia, unspecified: Secondary | ICD-10-CM

## 2023-05-31 ENCOUNTER — Other Ambulatory Visit (INDEPENDENT_AMBULATORY_CARE_PROVIDER_SITE_OTHER): Payer: 59

## 2023-05-31 DIAGNOSIS — R7401 Elevation of levels of liver transaminase levels: Secondary | ICD-10-CM

## 2023-05-31 DIAGNOSIS — E785 Hyperlipidemia, unspecified: Secondary | ICD-10-CM

## 2023-05-31 DIAGNOSIS — Z1159 Encounter for screening for other viral diseases: Secondary | ICD-10-CM

## 2023-05-31 LAB — CBC WITH DIFFERENTIAL/PLATELET
Basophils Absolute: 0.1 10*3/uL (ref 0.0–0.1)
Basophils Relative: 0.9 % (ref 0.0–3.0)
Eosinophils Absolute: 0.2 10*3/uL (ref 0.0–0.7)
Eosinophils Relative: 2.5 % (ref 0.0–5.0)
HCT: 48.4 % (ref 39.0–52.0)
Hemoglobin: 16.2 g/dL (ref 13.0–17.0)
Lymphocytes Relative: 31 % (ref 12.0–46.0)
Lymphs Abs: 1.9 10*3/uL (ref 0.7–4.0)
MCHC: 33.4 g/dL (ref 30.0–36.0)
MCV: 92.9 fL (ref 78.0–100.0)
Monocytes Absolute: 0.6 10*3/uL (ref 0.1–1.0)
Monocytes Relative: 9.5 % (ref 3.0–12.0)
Neutro Abs: 3.5 10*3/uL (ref 1.4–7.7)
Neutrophils Relative %: 56.1 % (ref 43.0–77.0)
Platelets: 256 10*3/uL (ref 150.0–400.0)
RBC: 5.21 Mil/uL (ref 4.22–5.81)
RDW: 12.8 % (ref 11.5–15.5)
WBC: 6.2 10*3/uL (ref 4.0–10.5)

## 2023-05-31 LAB — LIPID PANEL
Cholesterol: 181 mg/dL (ref 0–200)
HDL: 35.9 mg/dL — ABNORMAL LOW (ref >39.00)
LDL Cholesterol: 124 mg/dL — ABNORMAL HIGH (ref 0–99)
NonHDL: 145.07
Total CHOL/HDL Ratio: 5
Triglycerides: 104 mg/dL (ref 0.0–149.0)
VLDL: 20.8 mg/dL (ref 0.0–40.0)

## 2023-05-31 LAB — COMPREHENSIVE METABOLIC PANEL
ALT: 26 U/L (ref 0–53)
AST: 14 U/L (ref 0–37)
Albumin: 4.2 g/dL (ref 3.5–5.2)
Alkaline Phosphatase: 77 U/L (ref 39–117)
BUN: 13 mg/dL (ref 6–23)
CO2: 32 meq/L (ref 19–32)
Calcium: 9 mg/dL (ref 8.4–10.5)
Chloride: 104 meq/L (ref 96–112)
Creatinine, Ser: 0.93 mg/dL (ref 0.40–1.50)
GFR: 105.19 mL/min (ref >60.00)
Glucose, Bld: 93 mg/dL (ref 70–99)
Potassium: 4.3 meq/L (ref 3.5–5.1)
Sodium: 143 meq/L (ref 135–145)
Total Bilirubin: 0.5 mg/dL (ref 0.2–1.2)
Total Protein: 6.7 g/dL (ref 6.0–8.3)

## 2023-05-31 LAB — TSH: TSH: 1.17 u[IU]/mL (ref 0.35–5.50)

## 2023-06-01 LAB — HEPATITIS C ANTIBODY: Hepatitis C Ab: NONREACTIVE

## 2023-06-07 ENCOUNTER — Ambulatory Visit (INDEPENDENT_AMBULATORY_CARE_PROVIDER_SITE_OTHER): Payer: 59 | Admitting: Family Medicine

## 2023-06-07 VITALS — BP 126/78 | HR 84 | Temp 98.5°F | Ht 67.25 in | Wt 250.4 lb

## 2023-06-07 DIAGNOSIS — K760 Fatty (change of) liver, not elsewhere classified: Secondary | ICD-10-CM

## 2023-06-07 DIAGNOSIS — K219 Gastro-esophageal reflux disease without esophagitis: Secondary | ICD-10-CM

## 2023-06-07 DIAGNOSIS — Z0000 Encounter for general adult medical examination without abnormal findings: Secondary | ICD-10-CM

## 2023-06-07 DIAGNOSIS — I10 Essential (primary) hypertension: Secondary | ICD-10-CM | POA: Diagnosis not present

## 2023-06-07 DIAGNOSIS — E785 Hyperlipidemia, unspecified: Secondary | ICD-10-CM | POA: Diagnosis not present

## 2023-06-07 DIAGNOSIS — K449 Diaphragmatic hernia without obstruction or gangrene: Secondary | ICD-10-CM

## 2023-06-07 MED ORDER — AMLODIPINE BESY-BENAZEPRIL HCL 5-20 MG PO CAPS: 1.0000 | ORAL_CAPSULE | Freq: Every day | ORAL | 4 refills | Status: DC

## 2023-06-07 MED ORDER — OMEPRAZOLE 40 MG PO CPDR: 40.0000 mg | DELAYED_RELEASE_CAPSULE | Freq: Every day | ORAL | 4 refills | Status: DC

## 2023-06-07 MED ORDER — FAMOTIDINE 20 MG PO TABS: 20.0000 mg | ORAL_TABLET | Freq: Every evening | ORAL | Status: AC | PRN

## 2023-06-07 NOTE — Assessment & Plan Note (Signed)
Noted on imaging. LFTs normal.

## 2023-06-07 NOTE — Assessment & Plan Note (Signed)
Chronic, stable on current regimen - continue. 

## 2023-06-07 NOTE — Assessment & Plan Note (Addendum)
Chronic, longstanding, controlled with daily pantoprazole and PRN pepcid at night. Occasional breakthrough symptoms noted.  Noted symptom improvement when he lost weight over the summer, but he continued daily PPI.  Will change PPI to stronger omeprazole 40mg  daily.  Insurance previously did not cover nexium.  Again did recommend GI eval for EGD for chronic GERD with breakthrough symptoms despite daily PPI use. He will consider and let me know when ready for referral.

## 2023-06-07 NOTE — Progress Notes (Signed)
Ph: [phone] Fax: [phone]   Patient ID: Perry Bender, male    DOB: 14-Jul-1985, 37 y.o.   MRN: [id number]  This visit was conducted in person.  BP 126/78   Pulse 84   Temp 98.5 F (36.9 C) (Oral)   Ht 5' 7.25" (1.708 m)   Wt 250 lb 6 oz (113.6 kg)   SpO2 94%   BMI 38.92 kg/m    CC: CPE Subjective:   HPI: Perry Bender is a 37 y.o. male presenting on 06/07/2023 for Annual Exam   HTN - on lotrel 5/20mg  daily with benefit.  Reassuring cardiac evaluation with calcium score of zero 2022.  Echocardiogram 12/2020: WNL, EF 65-70%, normal wall motion, mild LVH, normal RV, poorly visualized AV, mild aortic root dilatation 39mm   GERD - continues pantoprazole 40mg  daily since 2019, also takes pepcid 20mg  nightly. Breakthrough symptoms if missed dose. Found to have hiatal hernia. Strong fmhx paternal side severe GERD. He notes when he lost weight reflux symptoms improved. Nexium was more effective but not covered by insurance. Quit caffeine 2 yrs ago. No diarrhea.   Saw allergist - tested negative for allergies. Trouble tolerating flonase. He is regularly taking 2 antihistamines as well as antihistamine eye drops. Dx non-allergenic reaction.   Father with h/o carcinoid tumor and B-cell lymphoma dx age 38s.   Preventative: Colon cancer screening - not due, no fmhx Prostate cancer screening - not due. Fmhx prostate cancer (paternal grandfather dx 107s).  Lung cancer screening - not due Flu shot -declines COVID shot x2 Tdap 01/2020 Pneumonia shot - not due Shingrix - not due Seat belt use discussed Sunscreen use discussed. No changing moles on skin. Sleep - averaging 6-7 hours/night Non smoker Alcohol - seldom Dentist - q6 mo Eye exam - 3 yrs ago   Lives with wife and 2 children (2023, 2015) Occupation: Works as Art gallery manager for [employer], previously Sport and exercise psychologist care  Edu: HS Activity: active at work  Diet: good water, fruits/vegetables daily      Relevant past medical, surgical, family and social history reviewed and updated as indicated. Interim medical history since our last visit reviewed. Allergies and medications reviewed and updated. Outpatient Medications Prior to Visit  Medication Sig Dispense Refill   clindamycin (CLEOCIN T) 1 % external solution Apply 1 application topically 2 (two) times daily. Appy on the skin twice daily     montelukast (SINGULAIR) 10 MG tablet Take 10 mg by mouth daily.     amLODipine-benazepril (LOTREL) 5-20 MG capsule TAKE ONE CAPSULE BY MOUTH ONCE DAILY 90 capsule 0   famotidine (PEPCID) 20 MG tablet Take 1 tablet (20 mg total) by mouth at bedtime.     pantoprazole (PROTONIX) 40 MG tablet TAKE ONE TABLET BY MOUTH ONCE A DAY 90 tablet 0   benzonatate (TESSALON) 100 MG capsule Take 1 capsule (100 mg total) by mouth 3 (three) times daily as needed for cough. Swallow, don't chew 30 capsule 0   No facility-administered medications prior to visit.     Per HPI unless specifically indicated in ROS section below Review of Systems  Constitutional:  Negative for activity change, appetite change, chills, fatigue, fever and unexpected weight change.  HENT:  Negative for hearing loss.   Eyes:  Negative for visual disturbance.  Respiratory:  Negative for cough, chest tightness, shortness of breath and wheezing.   Cardiovascular:  Negative for chest pain, palpitations and leg swelling.  Gastrointestinal:  Negative for abdominal distention, abdominal  pain, blood in stool, constipation, diarrhea, nausea and vomiting.  Genitourinary:  Negative for difficulty urinating and hematuria.  Musculoskeletal:  Negative for arthralgias, myalgias and neck pain.  Skin:  Negative for rash.  Neurological:  Negative for dizziness, seizures, syncope and headaches.  Hematological:  Negative for adenopathy. Does not bruise/bleed easily.  Psychiatric/Behavioral:  Negative for dysphoric mood. The patient is not nervous/anxious.      Objective:  BP 126/78   Pulse 84   Temp 98.5 F (36.9 C) (Oral)   Ht 5' 7.25" (1.708 m)   Wt 250 lb 6 oz (113.6 kg)   SpO2 94%   BMI 38.92 kg/m   Wt Readings from Last 3 Encounters:  06/07/23 250 lb 6 oz (113.6 kg)  01/31/23 245 lb (111.1 kg)  03/02/22 246 lb 8 oz (111.8 kg)      Physical Exam Vitals and nursing note reviewed.  Constitutional:      General: He is not in acute distress.    Appearance: Normal appearance. He is well-developed. He is not ill-appearing.  HENT:     Head: Normocephalic and atraumatic.     Right Ear: Hearing, tympanic membrane, ear canal and external ear normal.     Left Ear: Hearing, tympanic membrane, ear canal and external ear normal.     Mouth/Throat:     Mouth: Mucous membranes are moist.     Pharynx: Oropharynx is clear. No oropharyngeal exudate or posterior oropharyngeal erythema.  Eyes:     General: No scleral icterus.    Extraocular Movements: Extraocular movements intact.     Conjunctiva/sclera: Conjunctivae normal.     Pupils: Pupils are equal, round, and reactive to light.  Neck:     Thyroid: No thyroid mass or thyromegaly.  Cardiovascular:     Rate and Rhythm: Normal rate and regular rhythm.     Pulses: Normal pulses.          Radial pulses are 2+ on the right side and 2+ on the left side.     Heart sounds: Normal heart sounds. No murmur heard. Pulmonary:     Effort: Pulmonary effort is normal. No respiratory distress.     Breath sounds: Normal breath sounds. No wheezing, rhonchi or rales.  Abdominal:     General: Bowel sounds are normal. There is no distension.     Palpations: Abdomen is soft. There is no mass.     Tenderness: There is no abdominal tenderness. There is no guarding or rebound.     Hernia: No hernia is present.  Musculoskeletal:        General: Normal range of motion.     Cervical back: Normal range of motion and neck supple.     Right lower leg: No edema.     Left lower leg: No edema.  Lymphadenopathy:      Cervical: No cervical adenopathy.  Skin:    General: Skin is warm and dry.     Findings: No rash.  Neurological:     General: No focal deficit present.     Mental Status: He is alert and oriented to person, place, and time.  Psychiatric:        Mood and Affect: Mood normal.        Behavior: Behavior normal.        Thought Content: Thought content normal.        Judgment: Judgment normal.       Results for orders placed or performed in visit on 05/31/23  CBC  with Differential/Platelet   Collection Time: 05/31/23  8:06 AM  Result Value Ref Range   WBC 6.2 4.0 - 10.5 K/uL   RBC 5.21 4.22 - 5.81 Mil/uL   Hemoglobin 16.2 13.0 - 17.0 g/dL   HCT 96.0 45.4 - 09.8 %   MCV 92.9 78.0 - 100.0 fl   MCHC 33.4 30.0 - 36.0 g/dL   RDW 11.9 14.7 - 82.9 %   Platelets 256.0 150.0 - 400.0 K/uL   Neutrophils Relative % 56.1 43.0 - 77.0 %   Lymphocytes Relative 31.0 12.0 - 46.0 %   Monocytes Relative 9.5 3.0 - 12.0 %   Eosinophils Relative 2.5 0.0 - 5.0 %   Basophils Relative 0.9 0.0 - 3.0 %   Neutro Abs 3.5 1.4 - 7.7 K/uL   Lymphs Abs 1.9 0.7 - 4.0 K/uL   Monocytes Absolute 0.6 0.1 - 1.0 K/uL   Eosinophils Absolute 0.2 0.0 - 0.7 K/uL   Basophils Absolute 0.1 0.0 - 0.1 K/uL  Hepatitis C antibody   Collection Time: 05/31/23  8:06 AM  Result Value Ref Range   Hepatitis C Ab NON-REACTIVE NON-REACTIVE  TSH   Collection Time: 05/31/23  8:06 AM  Result Value Ref Range   TSH 1.17 0.35 - 5.50 uIU/mL  Comprehensive metabolic panel   Collection Time: 05/31/23  8:06 AM  Result Value Ref Range   Sodium 143 135 - 145 mEq/L   Potassium 4.3 3.5 - 5.1 mEq/L   Chloride 104 96 - 112 mEq/L   CO2 32 19 - 32 mEq/L   Glucose, Bld 93 70 - 99 mg/dL   BUN 13 6 - 23 mg/dL   Creatinine, Ser 5.62 0.40 - 1.50 mg/dL   Total Bilirubin 0.5 0.2 - 1.2 mg/dL   Alkaline Phosphatase 77 39 - 117 U/L   AST 14 0 - 37 U/L   ALT 26 0 - 53 U/L   Total Protein 6.7 6.0 - 8.3 g/dL   Albumin 4.2 3.5 - 5.2 g/dL   GFR  130.86 >57.84 mL/min   Calcium 9.0 8.4 - 10.5 mg/dL  Lipid panel   Collection Time: 05/31/23  8:06 AM  Result Value Ref Range   Cholesterol 181 0 - 200 mg/dL   Triglycerides 696.2 0.0 - 149.0 mg/dL   HDL 95.28 (L) >41.32 mg/dL   VLDL 44.0 0.0 - 10.2 mg/dL   LDL Cholesterol 725 (H) 0 - 99 mg/dL   Total CHOL/HDL Ratio 5    NonHDL 145.07     Assessment & Plan:   Problem List Items Addressed This Visit     Healthcare maintenance - Primary (Chronic)   Preventative protocols reviewed and updated unless pt declined. Discussed healthy diet and lifestyle.       HTN (hypertension)   Chronic, stable on current regimen - continue      Relevant Medications   amLODipine-benazepril (LOTREL) 5-20 MG capsule   Dyslipidemia   Chronic, mild off medication. Reviewed diet choices to improve cholesterol control. The ASCVD Risk score (Arnett DK, et al., 2019) failed to calculate for the following reasons:   The 2019 ASCVD risk score is only valid for ages 38 to 25       Severe obesity (BMI 35.0-39.9) with comorbidity (HCC)   Encouraged healthy diet and lifestyle choices to affect sustainable weight loss. He notes ongoing difficulty losing weight.  Wegovy/zepbound likely not covered by insurance - # provided for [employer] to price out Zepbound.  Discussed Noom philosophy, suggested look  into book The Noom Mindset.        Hiatal hernia with GERD   Chronic, longstanding, controlled with daily pantoprazole and PRN pepcid at night. Occasional breakthrough symptoms noted.  Noted symptom improvement when he lost weight over the summer, but he continued daily PPI.  Will change PPI to stronger omeprazole 40mg  daily.  Insurance previously did not cover nexium.  Again did recommend GI eval for EGD for chronic GERD with breakthrough symptoms despite daily PPI use. He will consider and let me know when ready for referral.       Relevant Medications   famotidine (PEPCID) 20 MG tablet    omeprazole (PRILOSEC) 40 MG capsule   Fatty liver   Noted on imaging. LFTs normal.         Meds ordered this encounter  Medications   amLODipine-benazepril (LOTREL) 5-20 MG capsule    Sig: Take 1 capsule by mouth daily.    Dispense:  90 capsule    Refill:  4   famotidine (PEPCID) 20 MG tablet    Sig: Take 1 tablet (20 mg total) by mouth at bedtime as needed for heartburn or indigestion.   omeprazole (PRILOSEC) 40 MG capsule    Sig: Take 1 capsule (40 mg total) by mouth daily.    Dispense:  90 capsule    Refill:  4    No orders of the defined types were placed in this encounter.   Patient Instructions  Let me know when ready to consider endoscopy and I will place new referral Continue lotrel.  Trial omeprazole in place of pantoprazole - update me with effect. May continue pepcid nightly as needed.  Good to see you today Return as needed or in 1 year for next physical   [employer] [facility] [facility] [place], [place] - [address]  [phone]  Book: The Noom mindset.   Follow up plan: Return in about 1 year (around 06/06/2024) for annual exam, prior fasting for blood work.  Eustaquio Boyden, MD

## 2023-06-07 NOTE — Assessment & Plan Note (Signed)
Preventative protocols reviewed and updated unless pt declined. Discussed healthy diet and lifestyle.  

## 2023-06-07 NOTE — Assessment & Plan Note (Signed)
Chronic, mild off medication. Reviewed diet choices to improve cholesterol control. The ASCVD Risk score (Arnett DK, et al., 2019) failed to calculate for the following reasons:   The 2019 ASCVD risk score is only valid for ages 90 to 55

## 2023-06-07 NOTE — Patient Instructions (Addendum)
Let me know when ready to consider endoscopy and I will place new referral Continue lotrel.  Trial omeprazole in place of pantoprazole - update me with effect. May continue pepcid nightly as needed.  Good to see you today Return as needed or in 1 year for next physical   [employer] [facility] FOR Perry Bender [place], [place] - [address]  [phone]  Book: The Noom mindset.

## 2023-06-07 NOTE — Assessment & Plan Note (Addendum)
Encouraged healthy diet and lifestyle choices to affect sustainable weight loss. He notes ongoing difficulty losing weight.  Wegovy/zepbound likely not covered by insurance - # provided for [employer] to price out Zepbound.  Discussed Noom philosophy, suggested look into book The Noom Mindset.

## 2023-06-11 ENCOUNTER — Encounter: Payer: Self-pay | Admitting: Family Medicine

## 2023-06-11 DIAGNOSIS — N132 Hydronephrosis with renal and ureteral calculous obstruction: Secondary | ICD-10-CM | POA: Insufficient documentation

## 2023-12-08 ENCOUNTER — Ambulatory Visit: Admitting: Internal Medicine

## 2023-12-08 ENCOUNTER — Encounter: Payer: Self-pay | Admitting: Internal Medicine

## 2023-12-08 VITALS — BP 110/76 | HR 86 | Temp 98.6°F | Ht 67.25 in | Wt 241.0 lb

## 2023-12-08 DIAGNOSIS — K6289 Other specified diseases of anus and rectum: Secondary | ICD-10-CM | POA: Insufficient documentation

## 2023-12-08 MED ORDER — HYDROCORTISONE 2.5 % EX CREA: TOPICAL_CREAM | Freq: Three times a day (TID) | CUTANEOUS | 3 refills | Status: AC | PRN

## 2023-12-08 NOTE — Assessment & Plan Note (Signed)
 Looks like he just has perirectal irritation---?from the spell of diarrhea he had Not constipated No apparent internal pathology Will try liberal use of 2.5% hydrocortisone cream Consider GI evaluation if persists

## 2023-12-08 NOTE — Progress Notes (Signed)
 Subjective:    Patient ID: Perry Bender, male    DOB: Jul 26, 1985, 38 y.o.   MRN: [id number]  HPI Here due to rectal pain  Hurts all the time---on fire, like someone lit a match to it Started almost 2 weeks ago Worse after moving bowels--then will fade off a bit Preparation H with lidocaine won't help No blood  No constipation--goes just about every day Started mounjaro 3 weeks ago---no abdominal pain (just some nausea with first 2 injections)  No prior hemorrhoid or rectal problems  Current Outpatient Medications on File Prior to Visit  Medication Sig Dispense Refill   amLODipine -benazepril  (LOTREL) 5-20 MG capsule Take 1 capsule by mouth daily. 90 capsule 4   clindamycin (CLEOCIN T) 1 % external solution Apply 1 application topically 2 (two) times daily. Appy on the skin twice daily     desloratadine (CLARINEX) 5 MG tablet Take 5 mg by mouth daily.     famotidine  (PEPCID ) 20 MG tablet Take 1 tablet (20 mg total) by mouth at bedtime as needed for heartburn or indigestion.     montelukast (SINGULAIR) 10 MG tablet Take 10 mg by mouth daily.     Olopatadine HCl 0.2 % SOLN PLACE ONE DROP INTO AFFECTED EYE OPHTHALMIC ONCE A DAY 30 DAYS; Duration: 30     omeprazole  (PRILOSEC) 40 MG capsule Take 1 capsule (40 mg total) by mouth daily. 90 capsule 4   tirzepatide (MOUNJARO) 2.5 MG/0.5ML Pen Inject 2.5 mg into the skin once a week.     No current facility-administered medications on file prior to visit.    Allergies  Allergen Reactions   Hctz [Hydrochlorothiazide] Other (See Comments)    Headaches   Lisinopril  Other (See Comments)    Cramps    Past Medical History:  Diagnosis Date   Dyslipidemia 2014   Family history of malignant hyperthermia 2016   paternal uncle and cousin   GERD (gastroesophageal reflux disease)    HTN (hypertension) 2012   Transaminitis 2014    Past Surgical History:  Procedure Laterality Date   WISDOM TOOTH EXTRACTION  2009    Family  History  Problem Relation Age of Onset   Thyroid  cancer Mother    Cancer Father        carcinoid tumor   Diabetes Father    Lymphoma Father        large B cell (NHL), bone and lung   Asthma Father    CAD Maternal Grandmother 72       CABG x2, multiple stents   Congestive Heart Failure Maternal Grandmother    Irritable bowel syndrome Maternal Grandmother    Valvular heart disease Maternal Grandmother 42       aortic valve replacement   Heart disease Maternal Grandmother    Diabetes Maternal Grandfather    Irritable bowel syndrome Maternal Grandfather    Prostate cancer Maternal Grandfather    Congestive Heart Failure Maternal Grandfather    Atrial fibrillation Maternal Grandfather    Stroke Paternal Grandfather    Malignant hyperthermia Paternal Uncle        uncle can't eat red med   CAD Paternal Uncle    Malignant hyperthermia Cousin     Social History   Socioeconomic History   Marital status: Married    Spouse name: Not on file   Number of children: 1   Years of education: Not on file   Highest education level: Some college, no degree  Occupational History   Not on  file  Tobacco Use   Smoking status: Former    Current packs/day: 0.00    Types: Cigarettes    Quit date: 08/25/2020    Years since quitting: 3.2   Smokeless tobacco: Never  Substance and Sexual Activity   Alcohol use: Yes    Comment: Occasional   Drug use: No   Sexual activity: Not on file  Other Topics Concern   Not on file  Social History Narrative   Lives alone, 1 dog   Occupation: Works as Art gallery manager for [employer], previously Sport and exercise psychologist care    Edu: HS   Activity: mows yards   Diet: good water, fruits/vegetables daily, herbalife   Social Drivers of Corporate investment banker Strain: Low Risk  (06/07/2023)   Overall Financial Resource Strain (CARDIA)    Difficulty of Paying Living Expenses: Not very hard  Food Insecurity: Unknown (06/07/2023)   Hunger Vital Sign    Worried  About Running Out of Food in the Last Year: Never true    Ran Out of Food in the Last Year: Patient declined  Transportation Needs: No Transportation Needs (06/07/2023)   PRAPARE - Administrator, Civil Service (Medical): No    Lack of Transportation (Non-Medical): No  Physical Activity: Insufficiently Active (06/07/2023)   Exercise Vital Sign    Days of Exercise per Week: 2 days    Minutes of Exercise per Session: 30 min  Stress: Stress Concern Present (06/07/2023)   [employer] of Occupational Health - Occupational Stress Questionnaire    Feeling of Stress : To some extent  Social Connections: Unknown (06/07/2023)   Social Connection and Isolation Panel    Frequency of Communication with Friends and Family: Three times a week    Frequency of Social Gatherings with Friends and Family: Once a week    Attends Religious Services: Patient declined    Database administrator or Organizations: No    Attends Engineer, structural: Not on file    Marital Status: Married  Catering manager Violence: Not on file   Review of Systems Did have spell of diarrhea 2 weeks ago--now with pain since then No urinary problems    Objective:   Physical Exam Constitutional:      Appearance: Normal appearance.  Abdominal:     Palpations: Abdomen is soft.     Tenderness: There is no abdominal tenderness.  Genitourinary:    Prostate: Normal.     Comments: Perirectal injection/inflammation No hemorrhoid or fissure No rectal mass or pain  Neurological:     Mental Status: He is alert.            Assessment & Plan:

## 2023-12-12 ENCOUNTER — Ambulatory Visit: Payer: Self-pay

## 2023-12-12 DIAGNOSIS — K6289 Other specified diseases of anus and rectum: Secondary | ICD-10-CM

## 2023-12-12 MED ORDER — NONFORMULARY OR COMPOUNDED ITEM: 0 refills | Status: AC

## 2023-12-12 NOTE — Telephone Encounter (Signed)
 Per 12/08/23 OV notes, Dr Jimmy suggested to consider GI evaluation if persists. Plz advise.

## 2023-12-12 NOTE — Addendum Note (Signed)
 Addended by: RILLA BALLER on: 12/12/2023 05:05 PM   Modules accepted: Orders

## 2023-12-12 NOTE — Telephone Encounter (Addendum)
 Recommend good water and fiber intake, daily sitz bath. I can Rx nitroglycerin ointment to use small amount daily to perianal area after sitz bath.   Spoke with patient  Currently at the [place], [place]. He will be coming back 1 wk from now.  Today noted blood with wiping - new. No hemorrhoid palpated with wiping. Sharp burning pain to area. Worse pain 30-40 min after BM. No pain with BM. This all started 2 wks ago after episode of diarrhea.  Prep H with lidocaine ineffective.  Hydrocortisone  caused more burning pain.  No noted rash.   Please phone in nitroglycerin ointment on Tuesday to below pharmacy.  [facility]  [address] [place], [place] [address] [phone]  Will also refer to GI for if ongoing symptoms when he's back in town

## 2023-12-12 NOTE — Addendum Note (Signed)
 Addended by: RILLA BALLER on: 12/12/2023 05:19 PM   Modules accepted: Orders

## 2023-12-12 NOTE — Telephone Encounter (Signed)
 FYI Only or Action Required?: Action required by provider: clinical question for provider.  Patient was last seen in primary care on 12/08/2023 by Jimmy Charlie FERNS, MD. Called Nurse Triage reporting Rectal Bleeding. Symptoms began several weeks ago. Interventions attempted: Prescription medications: hydrocortisone  cream. Symptoms are: unchanged.  Triage Disposition: See PCP When Office is Open (Within 3 Days)  Patient/caregiver understands and will follow disposition?: Yes  Copied from CRM [id number]. Topic: Clinical - Red Word Triage >> Dec 12, 2023  3:39 PM Lavanda D wrote: Red Word that prompted transfer to Nurse Triage: Patient is having some rectal bleeding that is not improving. Was prescribed hydrocortisone  cream but it didn't help. Patient said that the pain is keeping him from getting anything done. Reason for Disposition  MILD rectal bleeding (more than just a few drops or streaks)  Answer Assessment - Initial Assessment Questions Pt calling because he is still having discomfort that feels like a papercut on rectal region, constant sore annyoing feeling. Pt reports still having the same amount of bright red blood upon wiping on toilet paper. Pt was just seen in office past Thursday and examined, MD did not know if it was a fissure or hemorrhoid and was given hydrocortisone  cream. Pt reports some relief with hot showers, not much relief with hydrocortisone  cream his MD prescribed upon his recent visit on Thursday.  Pt requesting if there is anything else PCP can order or recommenced for pt because pt is out of town until next Monday.  Pt advised to seek further medical attention if pt develops worsening s/s, more blood in stools or abd pain, pt verbalizes understanding.   1. APPEARANCE of BLOOD: What color is it? Is it passed separately, on the surface of the stool, or mixed in with the stool?      Bright red blood 2. AMOUNT: How much blood was passed?      Blood on toilet paper,  not in toilet bowl  3. FREQUENCY: How many times has blood been passed with the stools?      With every BM 4. ONSET: When was the blood first seen in the stools? (Days or weeks)      Past 2 weeks after pt had an episode of diarrhea.  5. DIARRHEA: Is there also some diarrhea? If Yes, ask: How many diarrhea stools in the past 24 hours?      Diarrhea last Sunday and no has had this discomfort and blood upon wiping BM's 6. CONSTIPATION: Do you have constipation? If Yes, ask: How bad is it?     no 7. RECURRENT SYMPTOMS: Have you had blood in your stools before? If Yes, ask: When was the last time? and What happened that time?      no 8. BLOOD THINNERS: Do you take any blood thinners? (e.g., Coumadin/warfarin, Pradaxa/dabigatran, aspirin)     no 9. OTHER SYMPTOMS: Do you have any other symptoms?  (e.g., abdomen pain, vomiting, dizziness, fever)     No abd pain, no n/v  Protocols used: Rectal Bleeding-A-AH

## 2023-12-13 ENCOUNTER — Telehealth: Payer: Self-pay

## 2023-12-13 NOTE — Telephone Encounter (Signed)
 Copied from CRM [id number]. Topic: Clinical - Prescription Issue >> Dec 13, 2023  9:38 AM Mercedes MATSU wrote: Reason for CRM: Pharmacy called in stating that Dr. Ludwig Cma called in a rx Nitroglycerin 0.125% ointment and it may need to be compounded. Pharmacy is requesting a call back at [phone] [facility] pharmacy tech.

## 2023-12-13 NOTE — Telephone Encounter (Signed)
 Spoke with pharmacist, of [facility], [place], calling in rx for nitroglycerine oint 0.125%, #30 g/0 refills. Says she will take care of it for pt.

## 2023-12-13 NOTE — Telephone Encounter (Signed)
 Rtn Laura's call. States rx will need to be compounded, which they do not do at their location. However, the sent rx to their compounding pharmacy, [facility] in [place], [place] [eyw # [phone], and they will compound for pt and send to [facility] in [place] for pt to pick up. Leita says she made pt aware and pt's wife was going to contact [facility] to see if they could pick it up themselves. Fyi to Dr KANDICE.

## 2024-03-31 ENCOUNTER — Other Ambulatory Visit: Payer: Self-pay | Admitting: Family Medicine

## 2024-04-26 ENCOUNTER — Other Ambulatory Visit: Payer: Self-pay | Admitting: Family Medicine

## 2024-06-02 ENCOUNTER — Other Ambulatory Visit: Payer: Self-pay | Admitting: Family Medicine

## 2024-06-02 DIAGNOSIS — K760 Fatty (change of) liver, not elsewhere classified: Secondary | ICD-10-CM

## 2024-06-02 DIAGNOSIS — E785 Hyperlipidemia, unspecified: Secondary | ICD-10-CM

## 2024-06-02 DIAGNOSIS — I10 Essential (primary) hypertension: Secondary | ICD-10-CM

## 2024-06-05 ENCOUNTER — Other Ambulatory Visit: Payer: 59

## 2024-06-05 DIAGNOSIS — E785 Hyperlipidemia, unspecified: Secondary | ICD-10-CM

## 2024-06-05 DIAGNOSIS — I10 Essential (primary) hypertension: Secondary | ICD-10-CM

## 2024-06-05 DIAGNOSIS — K760 Fatty (change of) liver, not elsewhere classified: Secondary | ICD-10-CM

## 2024-06-05 LAB — COMPREHENSIVE METABOLIC PANEL WITH GFR
ALT: 24 U/L (ref 0–53)
AST: 14 U/L (ref 5–37)
Albumin: 4.2 g/dL (ref 3.5–5.2)
Alkaline Phosphatase: 71 U/L (ref 39–117)
BUN: 13 mg/dL (ref 6–23)
CO2: 30 meq/L (ref 19–32)
Calcium: 8.9 mg/dL (ref 8.4–10.5)
Chloride: 103 meq/L (ref 96–112)
Creatinine, Ser: 1 mg/dL (ref 0.40–1.50)
GFR: 95.73 mL/min (ref >60.00)
Glucose, Bld: 89 mg/dL (ref 70–99)
Potassium: 4 meq/L (ref 3.5–5.1)
Sodium: 139 meq/L (ref 135–145)
Total Bilirubin: 0.5 mg/dL (ref 0.2–1.2)
Total Protein: 6.7 g/dL (ref 6.0–8.3)

## 2024-06-05 LAB — CBC WITH DIFFERENTIAL/PLATELET
Basophils Absolute: 0.1 K/uL (ref 0.0–0.1)
Basophils Relative: 0.9 % (ref 0.0–3.0)
Eosinophils Absolute: 0.2 K/uL (ref 0.0–0.7)
Eosinophils Relative: 2.9 % (ref 0.0–5.0)
HCT: 45.4 % (ref 39.0–52.0)
Hemoglobin: 15.9 g/dL (ref 13.0–17.0)
Lymphocytes Relative: 31.6 % (ref 12.0–46.0)
Lymphs Abs: 2 K/uL (ref 0.7–4.0)
MCHC: 35 g/dL (ref 30.0–36.0)
MCV: 88.5 fl (ref 78.0–100.0)
Monocytes Absolute: 0.6 K/uL (ref 0.1–1.0)
Monocytes Relative: 8.6 % (ref 3.0–12.0)
Neutro Abs: 3.6 K/uL (ref 1.4–7.7)
Neutrophils Relative %: 56 % (ref 43.0–77.0)
Platelets: 291 K/uL (ref 150.0–400.0)
RBC: 5.13 Mil/uL (ref 4.22–5.81)
RDW: 12.9 % (ref 11.5–15.5)
WBC: 6.4 K/uL (ref 4.0–10.5)

## 2024-06-05 LAB — LIPID PANEL
Cholesterol: 150 mg/dL (ref 28–200)
HDL: 26.1 mg/dL — ABNORMAL LOW (ref >39.00)
LDL Cholesterol: 107 mg/dL — ABNORMAL HIGH (ref 0–99)
NonHDL: 124.1
Total CHOL/HDL Ratio: 6
Triglycerides: 85 mg/dL (ref 0.0–149.0)
VLDL: 17 mg/dL (ref 0.0–40.0)

## 2024-06-05 LAB — MICROALBUMIN / CREATININE URINE RATIO
Creatinine,U: 183.8 mg/dL
Microalb Creat Ratio: 4.2 mg/g (ref 0.0–30.0)
Microalb, Ur: 0.8 mg/dL (ref 0.0–1.9)

## 2024-06-11 ENCOUNTER — Ambulatory Visit (INDEPENDENT_AMBULATORY_CARE_PROVIDER_SITE_OTHER): Payer: 59 | Admitting: Family Medicine

## 2024-06-11 ENCOUNTER — Ambulatory Visit: Payer: Self-pay | Admitting: Family Medicine

## 2024-06-11 ENCOUNTER — Encounter: Payer: Self-pay | Admitting: Family Medicine

## 2024-06-11 VITALS — BP 128/78 | HR 95 | Temp 98.5°F | Ht 67.1 in | Wt 233.6 lb

## 2024-06-11 DIAGNOSIS — J101 Influenza due to other identified influenza virus with other respiratory manifestations: Secondary | ICD-10-CM

## 2024-06-11 DIAGNOSIS — K449 Diaphragmatic hernia without obstruction or gangrene: Secondary | ICD-10-CM

## 2024-06-11 DIAGNOSIS — R509 Fever, unspecified: Secondary | ICD-10-CM

## 2024-06-11 DIAGNOSIS — E785 Hyperlipidemia, unspecified: Secondary | ICD-10-CM

## 2024-06-11 DIAGNOSIS — J302 Other seasonal allergic rhinitis: Secondary | ICD-10-CM

## 2024-06-11 DIAGNOSIS — I10 Essential (primary) hypertension: Secondary | ICD-10-CM | POA: Diagnosis not present

## 2024-06-11 DIAGNOSIS — K219 Gastro-esophageal reflux disease without esophagitis: Secondary | ICD-10-CM | POA: Diagnosis not present

## 2024-06-11 DIAGNOSIS — Z0001 Encounter for general adult medical examination with abnormal findings: Secondary | ICD-10-CM

## 2024-06-11 DIAGNOSIS — K760 Fatty (change of) liver, not elsewhere classified: Secondary | ICD-10-CM

## 2024-06-11 LAB — POCT INFLUENZA A/B
Influenza A, POC: POSITIVE — AB
Influenza B, POC: NEGATIVE

## 2024-06-11 LAB — POC COVID19 BINAXNOW: SARS Coronavirus 2 Ag: NEGATIVE

## 2024-06-11 MED ORDER — OLOPATADINE HCL 0.2 % OP SOLN: 1.0000 [drp] | Freq: Every day | OPHTHALMIC | 3 refills | Status: AC

## 2024-06-11 MED ORDER — OMEPRAZOLE 40 MG PO CPDR: 40.0000 mg | DELAYED_RELEASE_CAPSULE | Freq: Every day | ORAL | 3 refills | Status: AC

## 2024-06-11 MED ORDER — DESLORATADINE 5 MG PO TABS: 5.0000 mg | ORAL_TABLET | Freq: Every day | ORAL | 3 refills | Status: AC

## 2024-06-11 MED ORDER — AMLODIPINE BESY-BENAZEPRIL HCL 5-20 MG PO CAPS: 1.0000 | ORAL_CAPSULE | Freq: Every day | ORAL | 4 refills | Status: AC

## 2024-06-11 MED ORDER — MONTELUKAST SODIUM 10 MG PO TABS: 10.0000 mg | ORAL_TABLET | Freq: Every day | ORAL | 3 refills | Status: AC

## 2024-06-11 MED ORDER — GUAIFENESIN-CODEINE 100-10 MG/5ML PO SOLN: 5.0000 mL | Freq: Three times a day (TID) | ORAL | 0 refills | Status: AC | PRN

## 2024-06-11 MED ORDER — OSELTAMIVIR PHOSPHATE 75 MG PO CAPS: 75.0000 mg | ORAL_CAPSULE | Freq: Two times a day (BID) | ORAL | 0 refills | Status: AC

## 2024-06-11 NOTE — Assessment & Plan Note (Signed)
 Non-allergenic seasonal allergic rhinitis - refill singulair , clarinex , and olopatadine  eye drops

## 2024-06-11 NOTE — Assessment & Plan Note (Signed)
 Chronic, off medication. Reviewed diet and lifestyle choices to improve cholesterol control. The ASCVD Risk score (Arnett DK, et al., 2019) failed to calculate for the following reasons:   The 2019 ASCVD risk score is only valid for ages 82 to 40

## 2024-06-11 NOTE — Assessment & Plan Note (Signed)
 Supportive measures reviewed. 38yo at home - Rx tamiflu  to speed resolution.  Rx cheratussin cough syrup for cough suppression.  Update if not improving with treatment.

## 2024-06-11 NOTE — Patient Instructions (Addendum)
 Congratulations on weight loss!  You tested positive for influenza. Push fluids and rest. May take codeine  cough syrup sent to pharmacy. Let us  know if not improving with treatment.  Medicines refilled.  Return as needed or in 1 year for next physical

## 2024-06-11 NOTE — Assessment & Plan Note (Signed)
 Preventative protocols reviewed and updated unless pt declined. Discussed healthy diet and lifestyle.

## 2024-06-11 NOTE — Assessment & Plan Note (Signed)
 Chronic, stable. Continue current regimen.

## 2024-06-11 NOTE — Assessment & Plan Note (Signed)
 Congratulated on ongoing weight loss through compounded semaglutide/B12.

## 2024-06-11 NOTE — Progress Notes (Signed)
 " Ph: [phone] Fax: [phone]   Patient ID: Perry Bender, male    DOB: 1986/01/19, 38 y.o.   MRN: [id number]  This visit was conducted in person.  BP 128/78 (Cuff Size: Normal)   Pulse 95   Temp 98.5 F (36.9 C) (Oral)   Ht 5' 7.1 (1.704 m)   Wt 233 lb 9.6 oz (106 kg)   SpO2 96%   BMI 36.48 kg/m    CC: CPE Subjective:   HPI: Perry Bender is a 38 y.o. male presenting on 06/11/2024 for Annual Exam (Pt had fever, chills, congestion, cough, raw throat, sniffles, onset 3 days /Pt tried otc meds but they did not provide any relief//Still having leg cramps with bp meds)   20 lb weight loss in the past year - using semaglutide compounded with B12 through [facility] online - doing this together with his wife.   Presents today with 3d fever/chills Tmax 101, congestion, dry cough, rhinorrhea, raw throat. Treating with OTC remedies with partial relief (dayquil, zycam spray). Daughter with similar illness. He called out of work on Sunday.   HTN - on lotrel 5/20mg  daily with benefit.  Reassuring cardiac evaluation with calcium score of zero 2022.  Echocardiogram 12/2020: WNL, EF 65-70%, normal wall motion, mild LVH, normal RV, poorly visualized AV, mild aortic root dilatation 39mm    GERD - doing well on omeprazole  40mg  since 2019, also takes pepcid  20mg  PRN. Very stable period. Found to have hiatal hernia. Strong fmhx paternal side severe GERD.   Saw allergist - tested negative for allergies. Hives to temperature changes. Trouble tolerating flonase . He is regularly taking oral antihistamine as well as antihistamine eye drops. Dx non-allergenic reaction.   Father with h/o carcinoid tumor and B-cell lymphoma dx age 72s.   Taking compounded semaglutide with b12.    Preventative: Colon cancer screening - not due, no fmhx Prostate cancer screening - not due. Fmhx prostate cancer (paternal grandfather dx 92s).  Lung cancer screening - not due Flu shot -  declines COVID shot x2 Tdap 01/2020 Pneumonia shot - not due Shingrix - not due Seat belt use discussed Sunscreen use discussed. No changing moles on skin.  Sleep - averaging 6-7 hours/night Non smoker Alcohol - seldom Dentist - q6 mo Eye exam - not regularly   Lives with wife and 2 children (2023, 2015)  Occupation: Works as art gallery manager for [employer], previously sport and exercise psychologist care  Edu: HS  Activity: active at work  Diet: good water, fruits/vegetables daily      Relevant past medical, surgical, family and social history reviewed and updated as indicated. Interim medical history since our last visit reviewed. Allergies and medications reviewed and updated. Outpatient Medications Prior to Visit  Medication Sig Dispense Refill   clindamycin (CLEOCIN T) 1 % external solution Apply 1 application topically 2 (two) times daily. Appy on the skin twice daily     famotidine  (PEPCID ) 20 MG tablet Take 1 tablet (20 mg total) by mouth at bedtime as needed for heartburn or indigestion.     hydrocortisone  2.5 % cream Apply topically 3 (three) times daily as needed. 28 g 3   NONFORMULARY OR COMPOUNDED ITEM Medication Name: Nitroglycerine ointment 0.125 %  Apply a pea sized amount three times daily as needed for rectal pain.  Dispense 30 GM zero refill 30 each 0   amLODipine -benazepril  (LOTREL) 5-20 MG capsule Take 1 capsule by mouth daily. 90 capsule 4   desloratadine  (CLARINEX ) 5 MG  tablet Take 5 mg by mouth daily.     montelukast  (SINGULAIR ) 10 MG tablet Take 10 mg by mouth daily.     Olopatadine  HCl 0.2 % SOLN PLACE ONE DROP INTO AFFECTED EYE OPHTHALMIC ONCE A DAY 30 DAYS; Duration: 30     omeprazole  (PRILOSEC) 40 MG capsule Take 1 capsule (40 mg total) by mouth daily. 90 capsule 4   tirzepatide (MOUNJARO) 2.5 MG/0.5ML Pen Inject 2.5 mg into the skin once a week.     No facility-administered medications prior to visit.     Per HPI unless specifically indicated in ROS section  below Review of Systems  Constitutional:  Positive for chills and fever. Negative for activity change, appetite change, fatigue and unexpected weight change.  HENT:  Positive for congestion, postnasal drip, rhinorrhea and sore throat. Negative for hearing loss.   Eyes:  Negative for visual disturbance.  Respiratory:  Positive for cough. Negative for chest tightness, shortness of breath and wheezing.   Cardiovascular:  Negative for chest pain, palpitations and leg swelling.  Gastrointestinal:  Negative for abdominal distention, abdominal pain, blood in stool, constipation, diarrhea, nausea and vomiting.  Genitourinary:  Negative for difficulty urinating and hematuria.  Musculoskeletal:  Negative for arthralgias, myalgias and neck pain.  Skin:  Negative for rash.  Neurological:  Negative for dizziness, seizures, syncope and headaches.  Hematological:  Negative for adenopathy. Does not bruise/bleed easily.  Psychiatric/Behavioral:  Negative for dysphoric mood. The patient is not nervous/anxious.     Objective:  BP 128/78 (Cuff Size: Normal)   Pulse 95   Temp 98.5 F (36.9 C) (Oral)   Ht 5' 7.1 (1.704 m)   Wt 233 lb 9.6 oz (106 kg)   SpO2 96%   BMI 36.48 kg/m   Wt Readings from Last 3 Encounters:  06/11/24 233 lb 9.6 oz (106 kg)  12/08/23 241 lb (109.3 kg)  06/07/23 250 lb 6 oz (113.6 kg)      Physical Exam Vitals and nursing note reviewed.  Constitutional:      General: He is not in acute distress.    Appearance: Normal appearance. He is well-developed. He is not ill-appearing.  HENT:     Head: Normocephalic and atraumatic.     Right Ear: Hearing, tympanic membrane, ear canal and external ear normal.     Left Ear: Hearing, tympanic membrane, ear canal and external ear normal.     Mouth/Throat:     Mouth: Mucous membranes are moist.     Pharynx: Oropharynx is clear. No oropharyngeal exudate or posterior oropharyngeal erythema.  Eyes:     General: No scleral icterus.     Extraocular Movements: Extraocular movements intact.     Conjunctiva/sclera: Conjunctivae normal.     Pupils: Pupils are equal, round, and reactive to light.  Neck:     Thyroid : No thyroid  mass or thyromegaly.  Cardiovascular:     Rate and Rhythm: Normal rate and regular rhythm.     Pulses: Normal pulses.          Radial pulses are 2+ on the right side and 2+ on the left side.     Heart sounds: Normal heart sounds. No murmur heard. Pulmonary:     Effort: Pulmonary effort is normal. No respiratory distress.     Breath sounds: Normal breath sounds. No wheezing, rhonchi or rales.  Abdominal:     General: Bowel sounds are normal. There is no distension.     Palpations: Abdomen is soft. There is no mass.  Tenderness: There is no abdominal tenderness. There is no guarding or rebound.     Hernia: No hernia is present.  Musculoskeletal:        General: Normal range of motion.     Cervical back: Normal range of motion and neck supple.     Right lower leg: No edema.     Left lower leg: No edema.  Lymphadenopathy:     Cervical: No cervical adenopathy.  Skin:    General: Skin is warm and dry.     Findings: No rash.  Neurological:     General: No focal deficit present.     Mental Status: He is alert and oriented to person, place, and time.  Psychiatric:        Mood and Affect: Mood normal.        Behavior: Behavior normal.        Thought Content: Thought content normal.        Judgment: Judgment normal.       Results for orders placed or performed in visit on 06/11/24  POC COVID-19 BinaxNow   Collection Time: 06/11/24  9:37 AM  Result Value Ref Range   SARS Coronavirus 2 Ag Negative Negative  POCT Influenza A/B   Collection Time: 06/11/24  9:37 AM  Result Value Ref Range   Influenza A, POC Positive (A) Negative   Influenza B, POC Negative Negative   Lab Results  Component Value Date   CHOL 150 06/05/2024   HDL 26.10 (L) 06/05/2024   LDLCALC 107 (H) 06/05/2024   TRIG 85.0  06/05/2024   CHOLHDL 6 06/05/2024    Lab Results  Component Value Date   NA 139 06/05/2024   CL 103 06/05/2024   K 4.0 06/05/2024   CO2 30 06/05/2024   BUN 13 06/05/2024   CREATININE 1.00 06/05/2024   GFR 95.73 06/05/2024   CALCIUM 8.9 06/05/2024   ALBUMIN 4.2 06/05/2024   GLUCOSE 89 06/05/2024   Lab Results  Component Value Date   ALT 24 06/05/2024   AST 14 06/05/2024   ALKPHOS 71 06/05/2024   BILITOT 0.5 06/05/2024     Lab Results  Component Value Date   TSH 1.17 05/31/2023    Lab Results  Component Value Date   WBC 6.4 06/05/2024   HGB 15.9 06/05/2024   HCT 45.4 06/05/2024   MCV 88.5 06/05/2024   PLT 291.0 06/05/2024    Assessment & Plan:   Problem List Items Addressed This Visit     Encounter for general adult medical examination with abnormal findings - Primary (Chronic)   Preventative protocols reviewed and updated unless pt declined. Discussed healthy diet and lifestyle.       HTN (hypertension)   Chronic, stable. Continue current regimen.       Relevant Medications   amLODipine -benazepril  (LOTREL) 5-20 MG capsule   Dyslipidemia   Chronic, off medication. Reviewed diet and lifestyle choices to improve cholesterol control. The ASCVD Risk score (Arnett DK, et al., 2019) failed to calculate for the following reasons:   The 2019 ASCVD risk score is only valid for ages 67 to 48       Severe obesity (BMI 35.0-39.9) with comorbidity (HCC)   Congratulated on ongoing weight loss through compounded semaglutide/B12.       Seasonal allergic rhinitis   Non-allergenic seasonal allergic rhinitis - refill singulair , clarinex , and olopatadine  eye drops      Hiatal hernia with GERD   Continue daily PPI with pepcid  PRN  Relevant Medications   omeprazole  (PRILOSEC) 40 MG capsule   Metabolic dysfunction-associated steatotic liver disease (MASLD)   Incidentally noted on cardiac CT 2022.  LFTs normal. Anticipate improvement with weight loss to date.  Fib4  score low risk.   Fibrosis 4 Score = .37 (Low risk)      Interpretation for patients with NAFLD          <1.30       -  F0-F1 (Low risk)          1.30-2.67 -  Indeterminate           >2.67      -  F3-F4 (High risk)     Validated for ages 4-65      Influenza A   Supportive measures reviewed. 38yo at home - Rx tamiflu  to speed resolution.  Rx cheratussin cough syrup for cough suppression.  Update if not improving with treatment.       Relevant Medications   oseltamivir  (TAMIFLU ) 75 MG capsule   Other Visit Diagnoses       Fever, unspecified fever cause       Relevant Orders   POC COVID-19 BinaxNow (Completed)   POCT Influenza A/B (Completed)        Meds ordered this encounter  Medications   amLODipine -benazepril  (LOTREL) 5-20 MG capsule    Sig: Take 1 capsule by mouth daily.    Dispense:  90 capsule    Refill:  4   montelukast  (SINGULAIR ) 10 MG tablet    Sig: Take 1 tablet (10 mg total) by mouth at bedtime.    Dispense:  90 tablet    Refill:  3   omeprazole  (PRILOSEC) 40 MG capsule    Sig: Take 1 capsule (40 mg total) by mouth daily.    Dispense:  90 capsule    Refill:  3   desloratadine  (CLARINEX ) 5 MG tablet    Sig: Take 1 tablet (5 mg total) by mouth daily.    Dispense:  90 tablet    Refill:  3   Olopatadine  HCl 0.2 % SOLN    Sig: Apply 1 drop to eye daily.    Dispense:  3.5 mL    Refill:  3   guaiFENesin -codeine  100-10 MG/5ML syrup    Sig: Take 5 mLs by mouth 3 (three) times daily as needed.    Dispense:  120 mL    Refill:  0   oseltamivir  (TAMIFLU ) 75 MG capsule    Sig: Take 1 capsule (75 mg total) by mouth 2 (two) times daily.    Dispense:  10 capsule    Refill:  0    Orders Placed This Encounter  Procedures   POC COVID-19 BinaxNow   POCT Influenza A/B    Patient Instructions  Congratulations on weight loss!  You tested positive for influenza. Push fluids and rest. May take codeine  cough syrup sent to pharmacy. Let us  know if not improving  with treatment.  Medicines refilled.  Return as needed or in 1 year for next physical   Follow up plan: Return in about 1 year (around 06/11/2025) for annual exam, prior fasting for blood work.  Anton Blas, MD   "

## 2024-06-11 NOTE — Assessment & Plan Note (Addendum)
 Incidentally noted on cardiac CT 2022.  LFTs normal. Anticipate improvement with weight loss to date.  Fib4 score low risk.   Fibrosis 4 Score = .37 (Low risk)      Interpretation for patients with NAFLD          <1.30       -  F0-F1 (Low risk)          1.30-2.67 -  Indeterminate           >2.67      -  F3-F4 (High risk)     Validated for ages 3-65

## 2024-06-11 NOTE — Assessment & Plan Note (Signed)
 Continue daily PPI with pepcid  PRN
